# Patient Record
Sex: Female | Born: 1970 | Race: White | Hispanic: No | Marital: Married | Smoking: Never smoker
Health system: Southern US, Community
[De-identification: ages and names within clinical notes are randomized; demographics above are authoritative.]

## PROBLEM LIST (undated history)

## (undated) DIAGNOSIS — K589 Irritable bowel syndrome without diarrhea: Secondary | ICD-10-CM

## (undated) DIAGNOSIS — R011 Cardiac murmur, unspecified: Secondary | ICD-10-CM

## (undated) DIAGNOSIS — I341 Nonrheumatic mitral (valve) prolapse: Secondary | ICD-10-CM

## (undated) DIAGNOSIS — N6019 Diffuse cystic mastopathy of unspecified breast: Secondary | ICD-10-CM

## (undated) HISTORY — DX: Cardiac murmur, unspecified: R01.1

## (undated) HISTORY — DX: Irritable bowel syndrome, unspecified: K58.9

## (undated) HISTORY — DX: Diffuse cystic mastopathy of unspecified breast: N60.19

## (undated) HISTORY — PX: TONSILLECTOMY: SUR1361

## (undated) HISTORY — PX: TUBAL LIGATION: SHX77

## (undated) HISTORY — DX: Nonrheumatic mitral (valve) prolapse: I34.1

---

## 1993-08-18 DIAGNOSIS — I341 Nonrheumatic mitral (valve) prolapse: Secondary | ICD-10-CM

## 1993-08-18 HISTORY — DX: Nonrheumatic mitral (valve) prolapse: I34.1

## 1996-08-18 HISTORY — PX: CHOLECYSTECTOMY: SHX55

## 1998-06-20 ENCOUNTER — Other Ambulatory Visit: Admission: RE | Admit: 1998-06-20 | Discharge: 1998-06-20 | Payer: Self-pay | Admitting: Obstetrics and Gynecology

## 1998-11-14 ENCOUNTER — Inpatient Hospital Stay (HOSPITAL_COMMUNITY): Admission: AD | Admit: 1998-11-14 | Discharge: 1998-11-14 | Payer: Self-pay | Admitting: Obstetrics and Gynecology

## 1998-11-16 ENCOUNTER — Inpatient Hospital Stay (HOSPITAL_COMMUNITY): Admission: AD | Admit: 1998-11-16 | Discharge: 1998-11-16 | Payer: Self-pay | Admitting: *Deleted

## 1999-01-08 ENCOUNTER — Inpatient Hospital Stay (HOSPITAL_COMMUNITY): Admission: AD | Admit: 1999-01-08 | Discharge: 1999-01-11 | Payer: Self-pay | Admitting: Obstetrics and Gynecology

## 1999-02-13 ENCOUNTER — Other Ambulatory Visit: Admission: RE | Admit: 1999-02-13 | Discharge: 1999-02-13 | Payer: Self-pay | Admitting: Obstetrics and Gynecology

## 1999-04-05 ENCOUNTER — Other Ambulatory Visit: Admission: RE | Admit: 1999-04-05 | Discharge: 1999-04-05 | Payer: Self-pay | Admitting: *Deleted

## 2000-02-14 ENCOUNTER — Other Ambulatory Visit: Admission: RE | Admit: 2000-02-14 | Discharge: 2000-02-14 | Payer: Self-pay | Admitting: Obstetrics and Gynecology

## 2001-02-17 ENCOUNTER — Other Ambulatory Visit: Admission: RE | Admit: 2001-02-17 | Discharge: 2001-02-17 | Payer: Self-pay | Admitting: Obstetrics and Gynecology

## 2001-04-22 ENCOUNTER — Ambulatory Visit (HOSPITAL_COMMUNITY): Admission: RE | Admit: 2001-04-22 | Discharge: 2001-04-22 | Payer: Self-pay | Admitting: *Deleted

## 2001-04-22 ENCOUNTER — Encounter: Payer: Self-pay | Admitting: *Deleted

## 2001-09-12 ENCOUNTER — Inpatient Hospital Stay (HOSPITAL_COMMUNITY): Admission: AD | Admit: 2001-09-12 | Discharge: 2001-09-12 | Payer: Self-pay | Admitting: *Deleted

## 2001-09-12 ENCOUNTER — Encounter: Payer: Self-pay | Admitting: *Deleted

## 2001-09-22 ENCOUNTER — Emergency Department (HOSPITAL_COMMUNITY): Admission: EM | Admit: 2001-09-22 | Discharge: 2001-09-22 | Payer: Self-pay | Admitting: Emergency Medicine

## 2001-11-03 ENCOUNTER — Ambulatory Visit (HOSPITAL_COMMUNITY): Admission: AD | Admit: 2001-11-03 | Discharge: 2001-11-03 | Payer: Self-pay | Admitting: Obstetrics and Gynecology

## 2001-11-12 ENCOUNTER — Inpatient Hospital Stay (HOSPITAL_COMMUNITY): Admission: RE | Admit: 2001-11-12 | Discharge: 2001-11-16 | Payer: Self-pay | Admitting: Obstetrics and Gynecology

## 2001-12-10 ENCOUNTER — Other Ambulatory Visit: Admission: RE | Admit: 2001-12-10 | Discharge: 2001-12-10 | Payer: Self-pay | Admitting: Obstetrics and Gynecology

## 2002-12-12 ENCOUNTER — Other Ambulatory Visit: Admission: RE | Admit: 2002-12-12 | Discharge: 2002-12-12 | Payer: Self-pay | Admitting: Obstetrics and Gynecology

## 2004-06-06 ENCOUNTER — Encounter: Admission: RE | Admit: 2004-06-06 | Discharge: 2004-06-06 | Payer: Self-pay | Admitting: Unknown Physician Specialty

## 2004-06-06 IMAGING — US US MD EVAL AND MANAGEMENT - [facility]PT [facility]
1 series · 13 of 16 positions shown · non-contrast
Comparison: none

[Series 1: unknown · 13 of 33 slices shown]
[im 1/33]
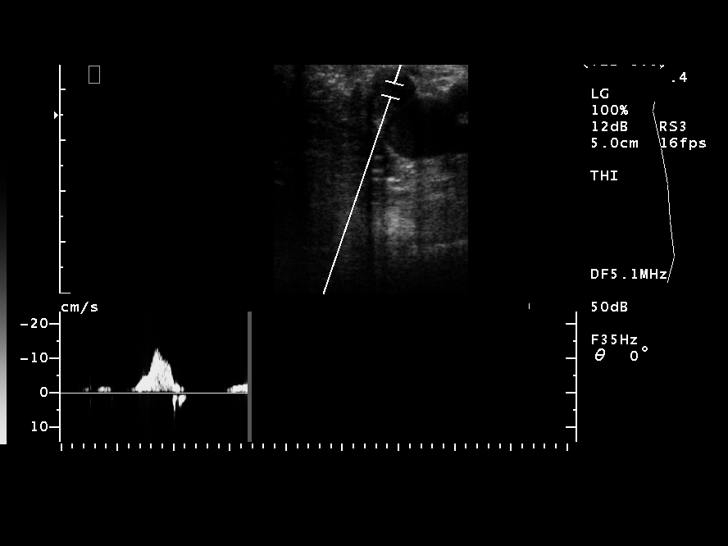
[im 3/33]
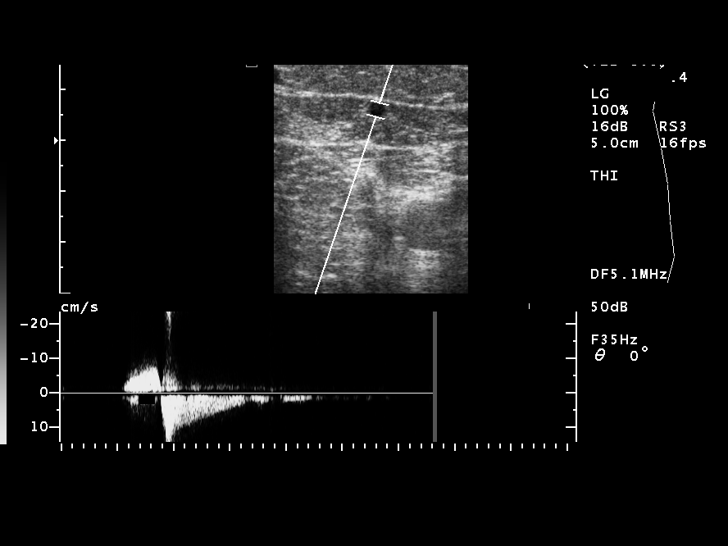
[im 7/33]
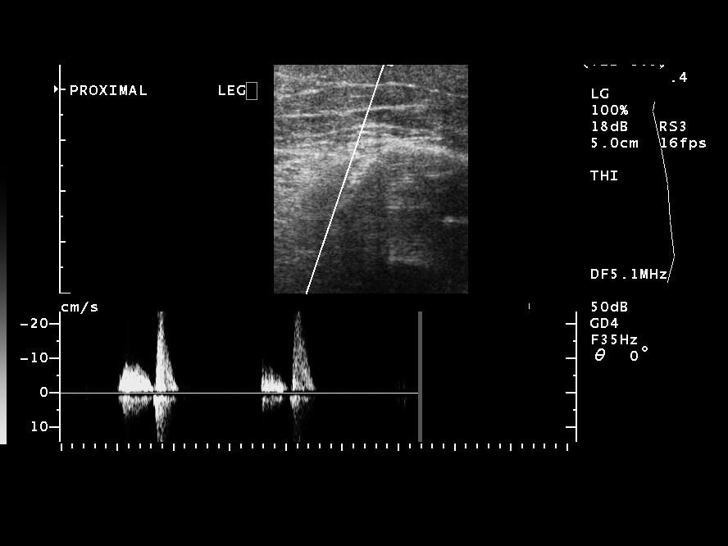
[im 9/33]
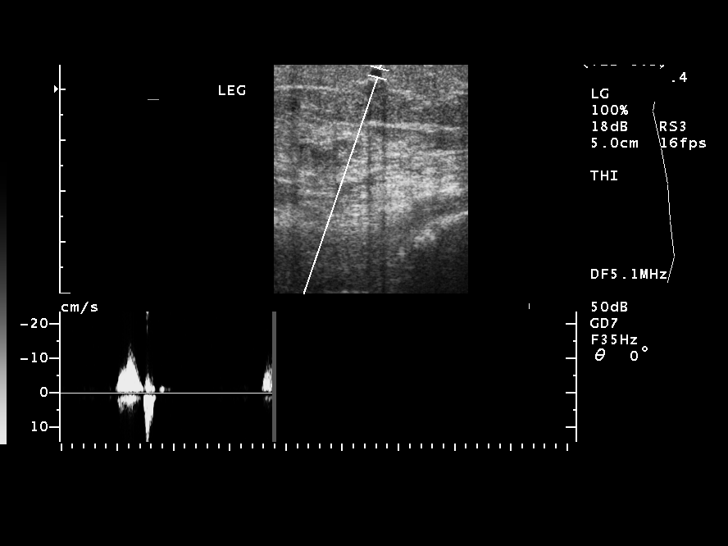
[im 11/33]
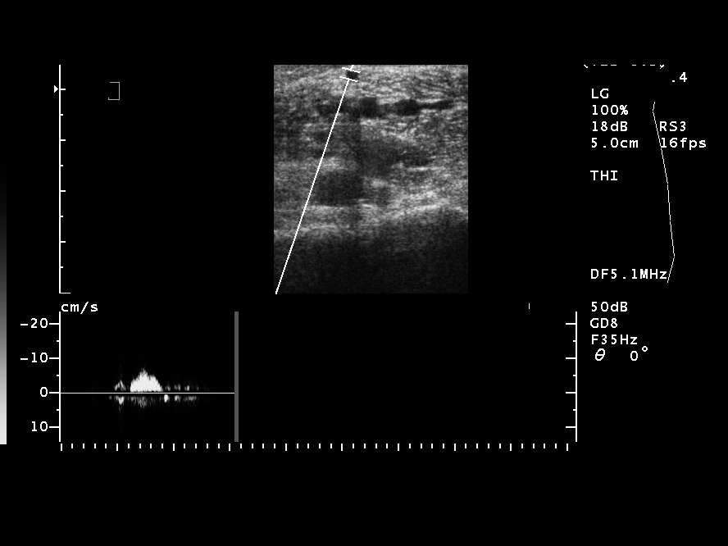
[im 13/33]
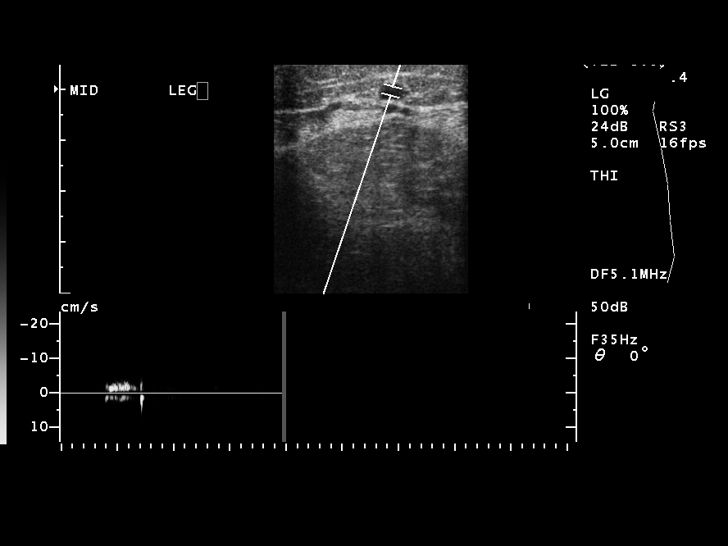
[im 18/33]
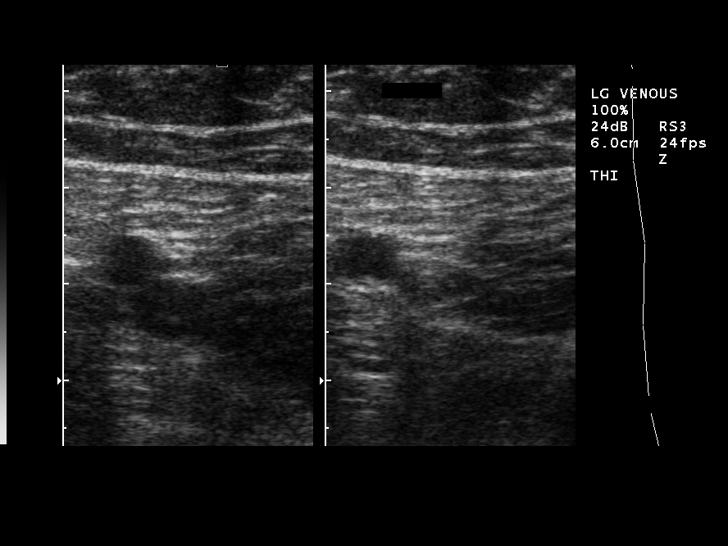
[im 20/33]
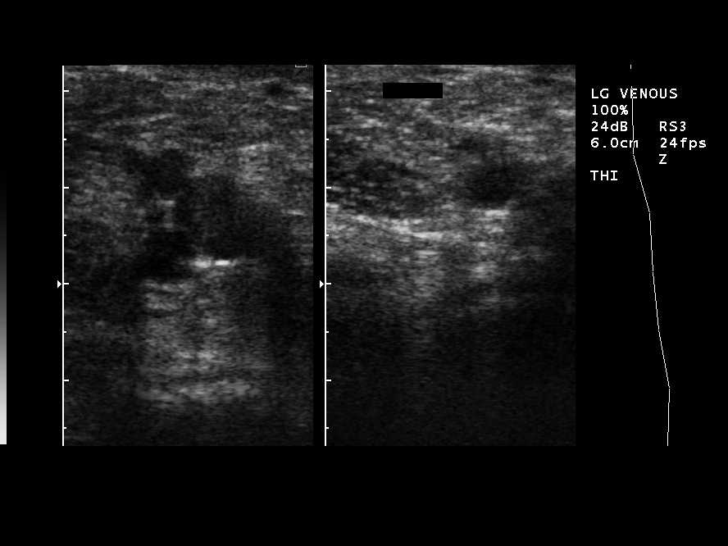
[im 22/33]
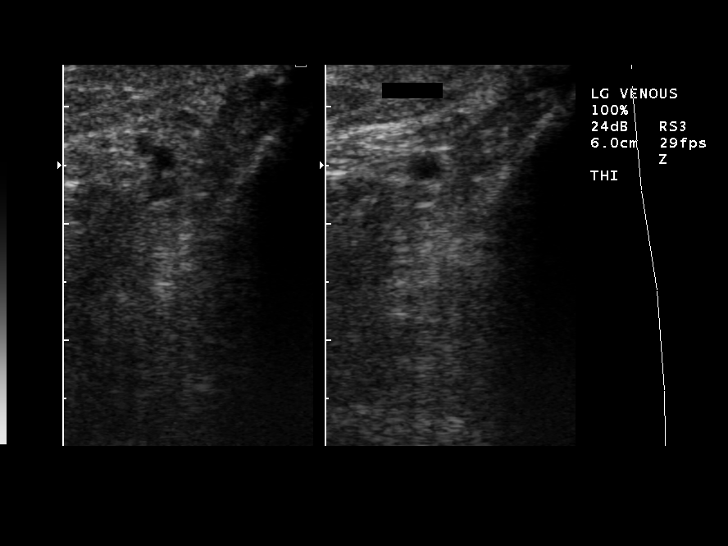
[im 24/33]
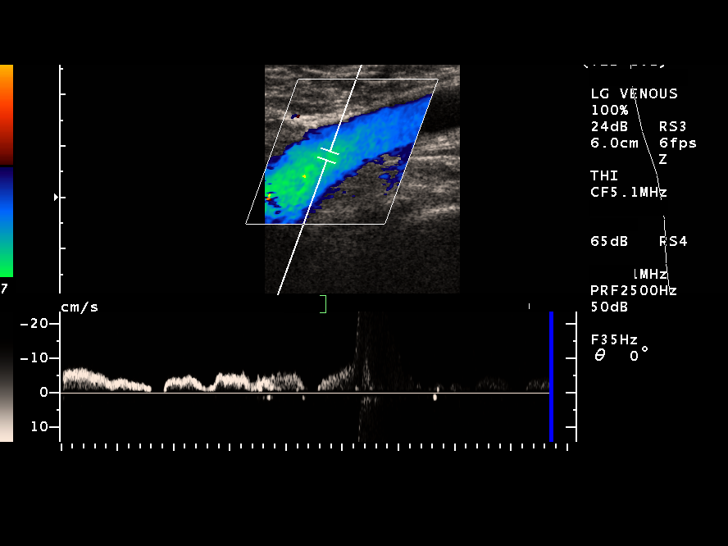
[im 26/33]
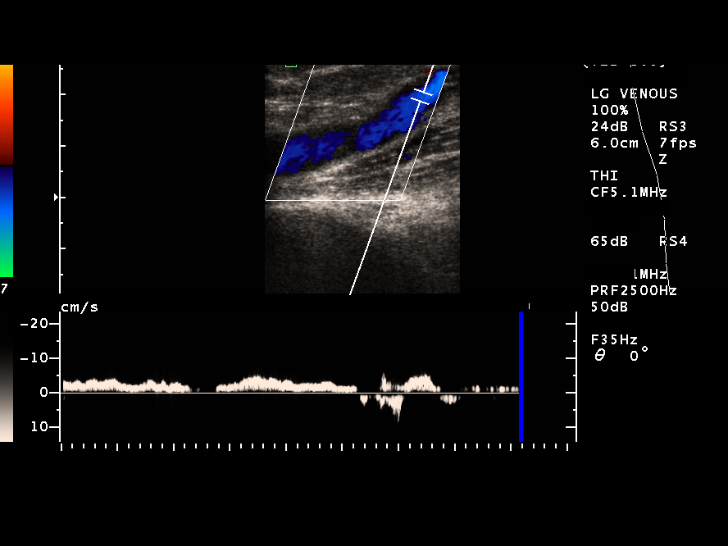
[im 30/33]
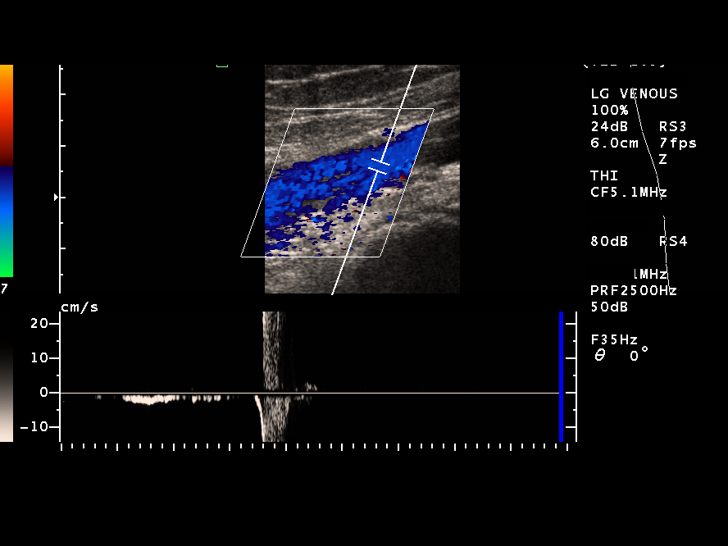
[im 33/33]
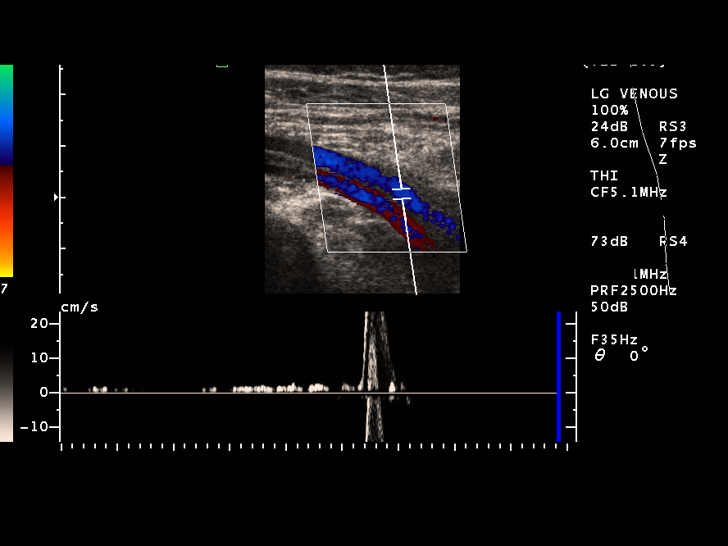

[13 of 16 positions shown; findings below may reference images not displayed]

June 06, 2004
 [HOSPITAL], [REDACTED].  [id number]
 RE:  Kampundu Serwenda (DOB ? [birth year])
 Dear Dr. Fu:
 Thank you for your referral of Ms. Ndy La to me for evaluation and possible treatment for lower extremity varicose veins.  As you know, Ms. Vie is a 33 year old with a history of worsening varicose veins in her left leg.  The patient describes that these arose during pregnancy and have worsened with subsequent pregnancies.  These were very severe and painful during her last pregnancy in 3552.  Since that time, the patient describes them as initially improving.  Now, however, they are worsening with significant left leg pain particularly around the large varicosities in her medial calf.  The patient also describes swelling in her left leg late in the day and discoloration.  
 The patient does have a strong family history of venous insufficiency with varicose veins in her mother and grandmother.  
 The patient has been in compression hose since 3552.  Despite this, the varicosities and the leg pain are worsening.  
 On physical exam, there are large varicosities noted within the medial proximal left calf.  Smaller varicosities are noted anteriorly in the left calf and posterior to the left knee.  No significant varicosity is noted at this time on the right.  
 The patient underwent left lower extremity venous ultrasound which was dictated in a separate report.  In short, this does show the patient has left great saphenous vein reflux from the mid thigh to the mid calf level.  The varicosities around the left knee and in the left calf do arise from this refluxing segment of great saphenous vein.  
 Given the patient?s symptomatology and venous ultrasound findings, the patient is a candidate for endovenous laser ablation of the left great saphenous vein. The patient is interested in proceeding with the procedure.  Therefore, she will be set up for the procedure in the near future.
 Again thank you for your referral of Ms. Vie to me for evaluation and treatment for lower extremity venous insufficiency.  We will keep you informed of further treatments and visits. 
 I spent approximately 45 minutes with the patient in direct consultation. 
 Sincerely,
 KGD:co

## 2004-08-18 HISTORY — PX: ABDOMINAL HYSTERECTOMY: SHX81

## 2004-08-18 HISTORY — PX: VEIN SURGERY: SHX48

## 2004-12-05 ENCOUNTER — Ambulatory Visit: Payer: Self-pay | Admitting: Unknown Physician Specialty

## 2007-04-16 ENCOUNTER — Ambulatory Visit: Payer: Self-pay | Admitting: Internal Medicine

## 2007-04-16 IMAGING — US US EXTREM LOW VENOUS*L*
1 series · 17 of 24 positions shown · non-contrast
Comparison: none

REASON FOR EXAM: pain hx DVT Eval for DVT  Call Report  [id number]
COMMENTS:

[Series 1: us extrem low venous*left* · 17 of 26 slices shown]
[im 1/26]
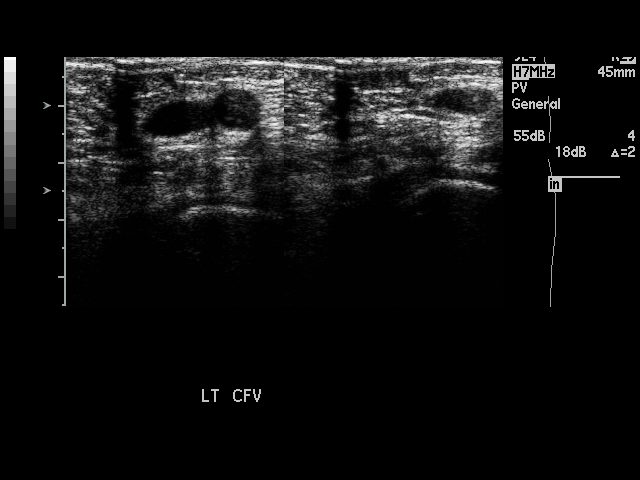
[im 3/26]
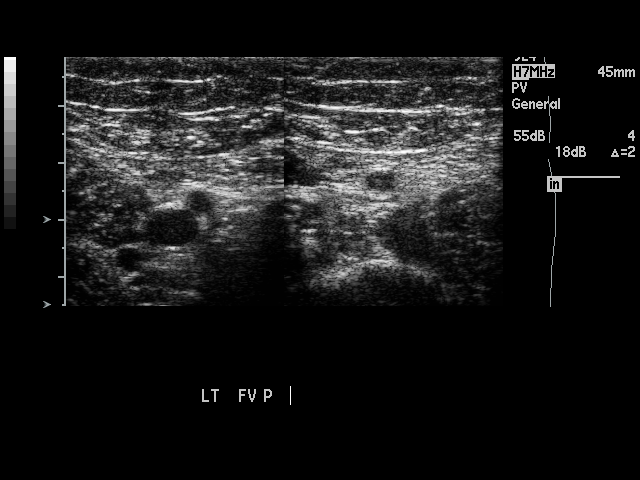
[im 4/26]
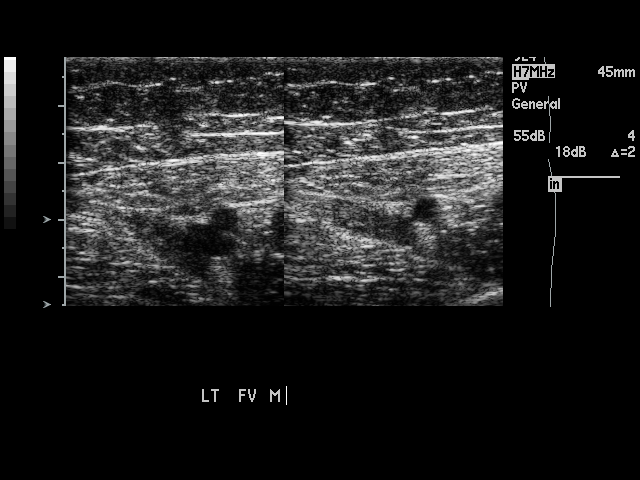
[im 5/26]
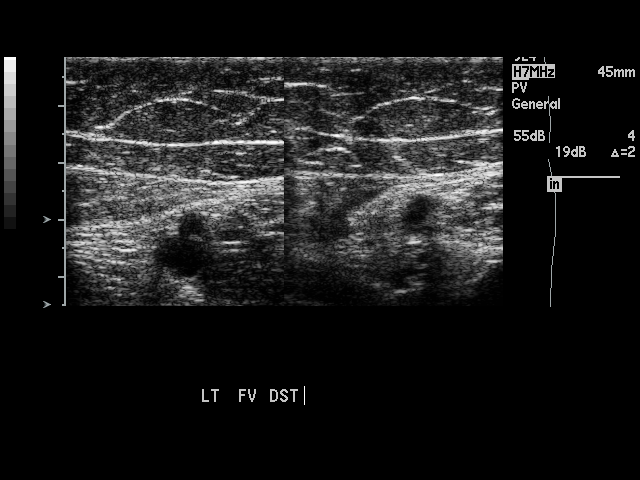
[im 7/26]
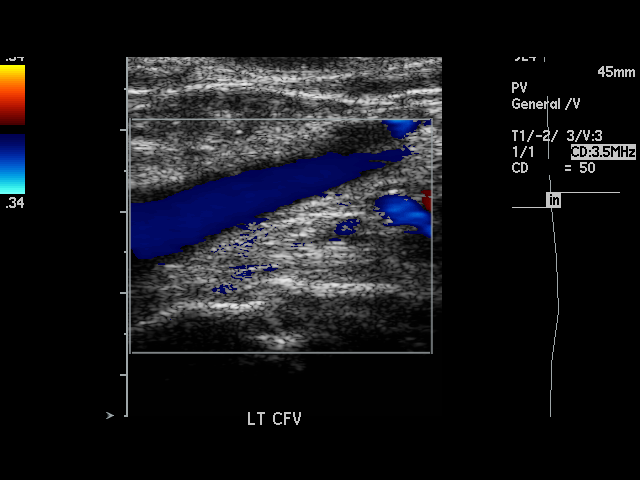
[im 8/26]
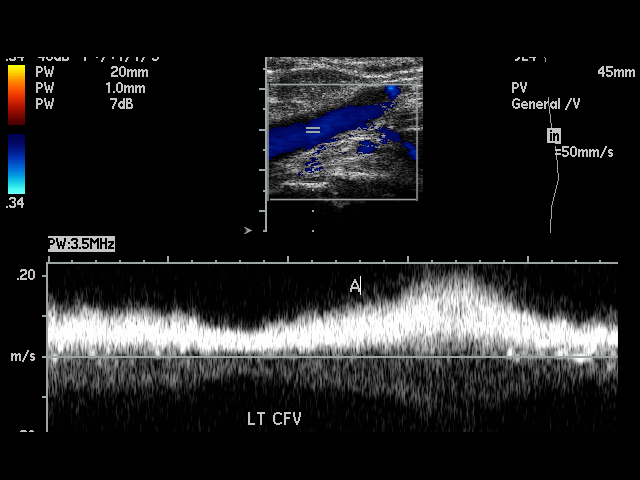
[im 10/26]
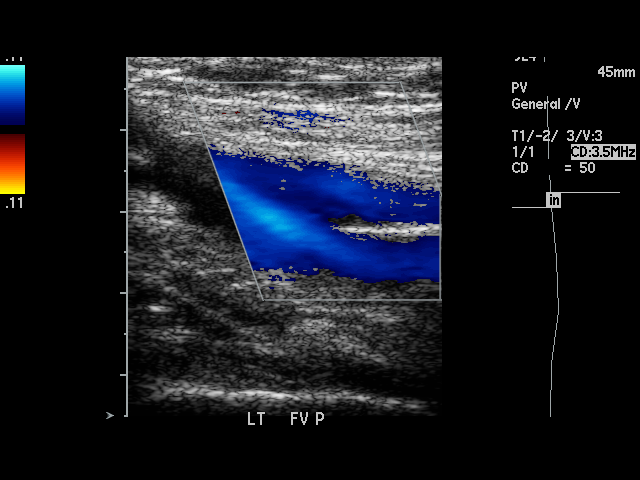
[im 11/26]
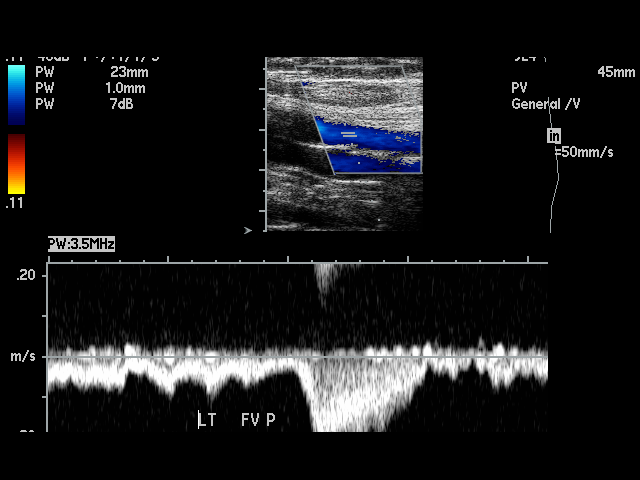
[im 14/26]
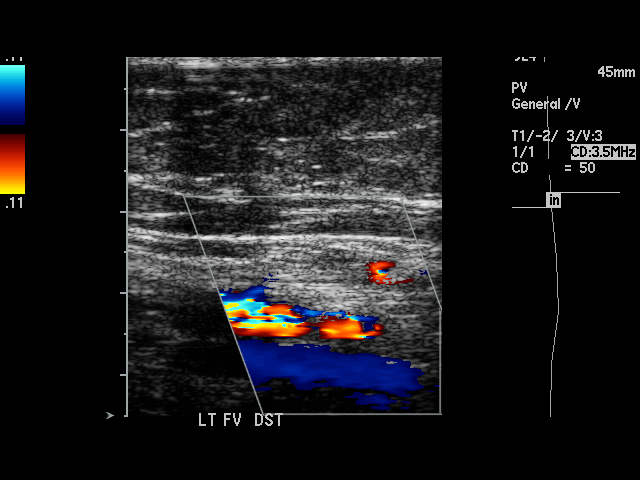
[im 15/26]
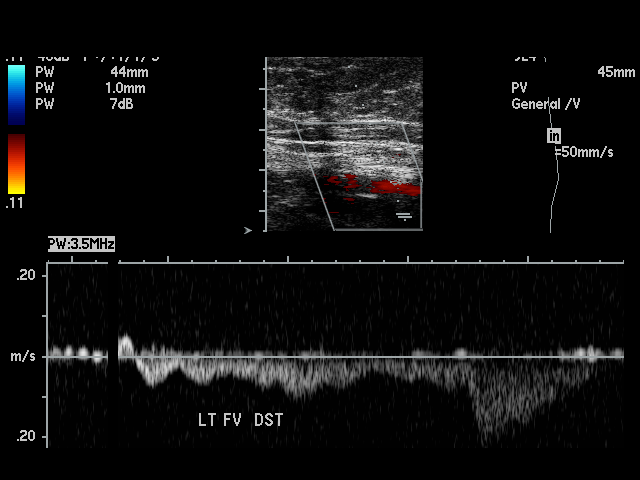
[im 16/26]
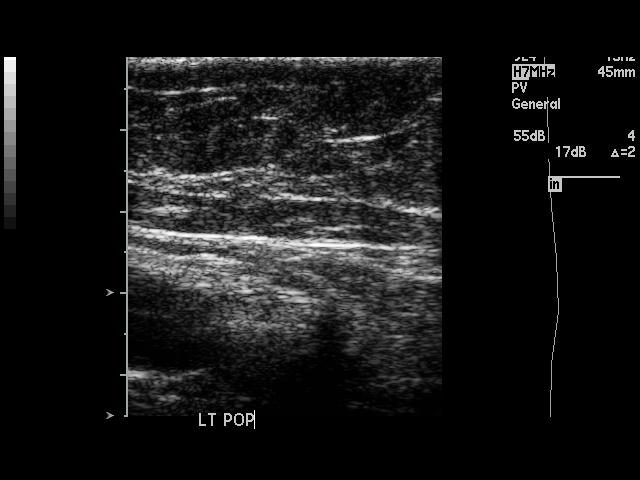
[im 18/26]
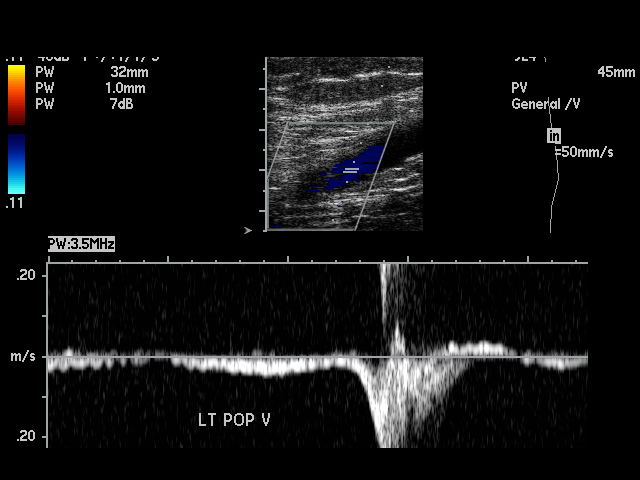
[im 19/26]
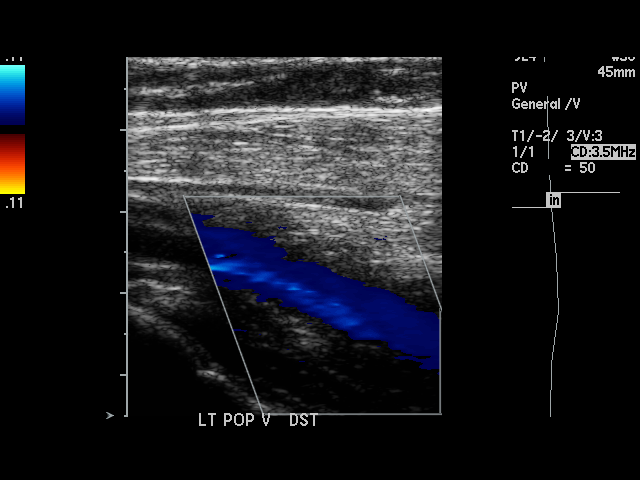
[im 21/26]
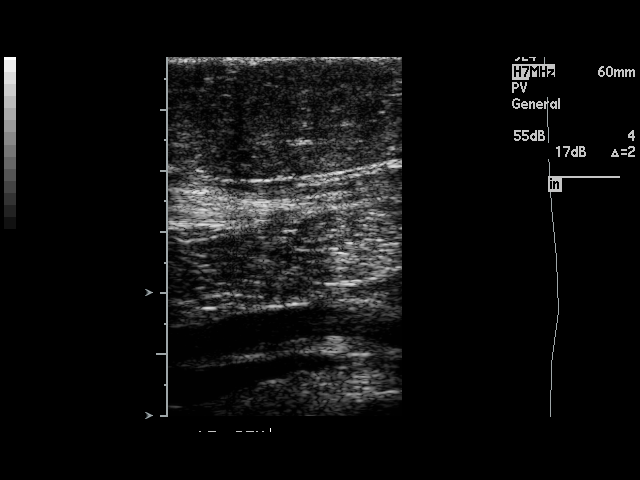
[im 22/26]
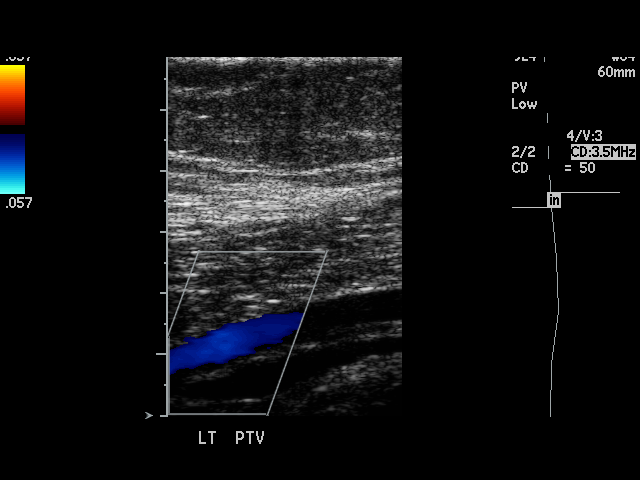
[im 23/26]
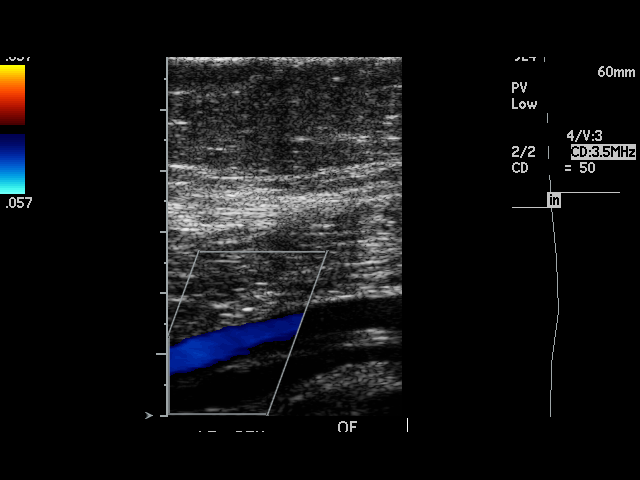
[im 26/26]
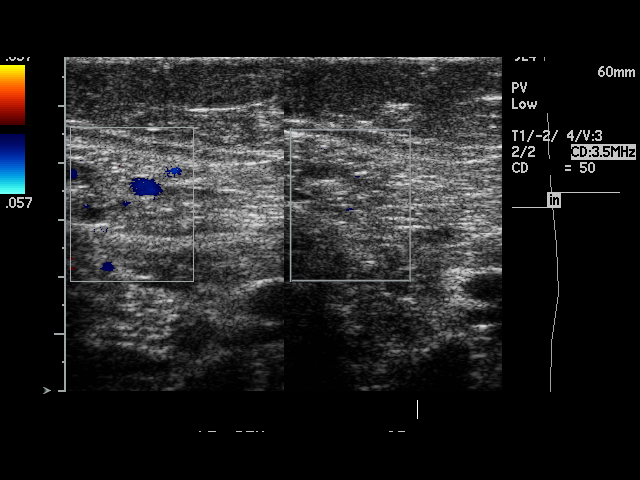

[17 of 24 positions shown; findings below may reference images not displayed]

PROCEDURE:     US  - US DOPPLER LOW EXTR LEFT  - April 16, 2007 [DATE]

RESULT:     The femoral and popliteal augmentation flow waveforms are
normal. There is noted complete compressibility of the LEFT the femoral and
popliteal vein. Doppler examination shows no occlusion or evidence of deep
venous thrombosis.
IMPRESSION: No deep vein thrombosis is identified in the LEFT leg.

## 2009-08-18 DIAGNOSIS — N6019 Diffuse cystic mastopathy of unspecified breast: Secondary | ICD-10-CM

## 2009-08-18 HISTORY — DX: Diffuse cystic mastopathy of unspecified breast: N60.19

## 2010-07-18 ENCOUNTER — Ambulatory Visit: Payer: Self-pay | Admitting: Unknown Physician Specialty

## 2011-06-16 ENCOUNTER — Ambulatory Visit: Payer: Self-pay | Admitting: General Surgery

## 2011-06-17 LAB — PATHOLOGY REPORT

## 2011-12-03 ENCOUNTER — Emergency Department: Payer: Self-pay | Admitting: Internal Medicine

## 2011-12-03 LAB — CBC WITH DIFFERENTIAL/PLATELET
Basophil %: 0.5 %
Eosinophil #: 0 10*3/uL (ref 0.0–0.7)
HCT: 41.3 % (ref 35.0–47.0)
HGB: 13.4 g/dL (ref 12.0–16.0)
Lymphocyte #: 0.9 10*3/uL — ABNORMAL LOW (ref 1.0–3.6)
Lymphocyte %: 12.1 %
MCHC: 32.5 g/dL (ref 32.0–36.0)
MCV: 95 fL (ref 80–100)
Monocyte #: 0.4 x10 3/mm (ref 0.2–0.9)
Neutrophil #: 6.1 10*3/uL (ref 1.4–6.5)
RBC: 4.34 10*6/uL (ref 3.80–5.20)

## 2011-12-03 LAB — BASIC METABOLIC PANEL
Anion Gap: 10 (ref 7–16)
BUN: 11 mg/dL (ref 7–18)
Co2: 27 mmol/L (ref 21–32)
Creatinine: 0.67 mg/dL (ref 0.60–1.30)
EGFR (African American): >60
Osmolality: 277 (ref 275–301)
Potassium: 3.9 mmol/L (ref 3.5–5.1)
Sodium: 139 mmol/L (ref 136–145)

## 2012-09-10 ENCOUNTER — Other Ambulatory Visit: Payer: Self-pay | Admitting: Internal Medicine

## 2012-09-12 ENCOUNTER — Emergency Department: Payer: Self-pay | Admitting: Unknown Physician Specialty

## 2012-09-12 LAB — URINALYSIS, COMPLETE
Bilirubin,UR: NEGATIVE
Glucose,UR: NEGATIVE mg/dL (ref 0–75)
Nitrite: NEGATIVE
Ph: 7 (ref 4.5–8.0)
Protein: NEGATIVE
WBC UR: 6 /HPF (ref 0–5)

## 2012-09-12 LAB — CBC WITH DIFFERENTIAL/PLATELET
Basophil #: 0.1 10*3/uL (ref 0.0–0.1)
Eosinophil #: 0 10*3/uL (ref 0.0–0.7)
Lymphocyte #: 1.3 10*3/uL (ref 1.0–3.6)
Lymphocyte %: 15.9 %
MCHC: 34.4 g/dL (ref 32.0–36.0)
MCV: 95 fL (ref 80–100)
Platelet: 315 10*3/uL (ref 150–440)
RBC: 4.55 10*6/uL (ref 3.80–5.20)

## 2012-09-12 LAB — HEPATIC FUNCTION PANEL A (ARMC)
Albumin: 4.2 g/dL (ref 3.4–5.0)
Alkaline Phosphatase: 82 U/L (ref 50–136)
Bilirubin, Direct: 0.2 mg/dL (ref 0.00–0.20)
Bilirubin,Total: 0.9 mg/dL (ref 0.2–1.0)
SGOT(AST): 18 U/L (ref 15–37)
SGPT (ALT): 20 U/L (ref 12–78)
Total Protein: 8.1 g/dL (ref 6.4–8.2)

## 2012-09-12 LAB — BASIC METABOLIC PANEL
Anion Gap: 6 — ABNORMAL LOW (ref 7–16)
Calcium, Total: 9.2 mg/dL (ref 8.5–10.1)
Creatinine: 0.59 mg/dL — ABNORMAL LOW (ref 0.60–1.30)
EGFR (African American): >60
Glucose: 124 mg/dL — ABNORMAL HIGH (ref 65–99)
Osmolality: 278 (ref 275–301)
Sodium: 139 mmol/L (ref 136–145)

## 2012-09-12 IMAGING — CR DG CHEST 2V
1 series · 2 of 2 positions shown · non-contrast
Comparison: none

REASON FOR EXAM: weakness enlarged lymph nodes
COMMENTS:

PROCEDURE:     DXR - DXR CHEST PA (OR AP) AND LATERAL  - September 12, 2012  [DATE]
RESULT:     The lungs are clear. The heart and pulmonary vessels are normal.
The bony and mediastinal structures are unremarkable. There is no effusion.
There is no pneumothorax or evidence of congestive failure.

[Series 1: w chest pa · 0.14mm/px · 2 of 2 slices shown]
[im 1/2]
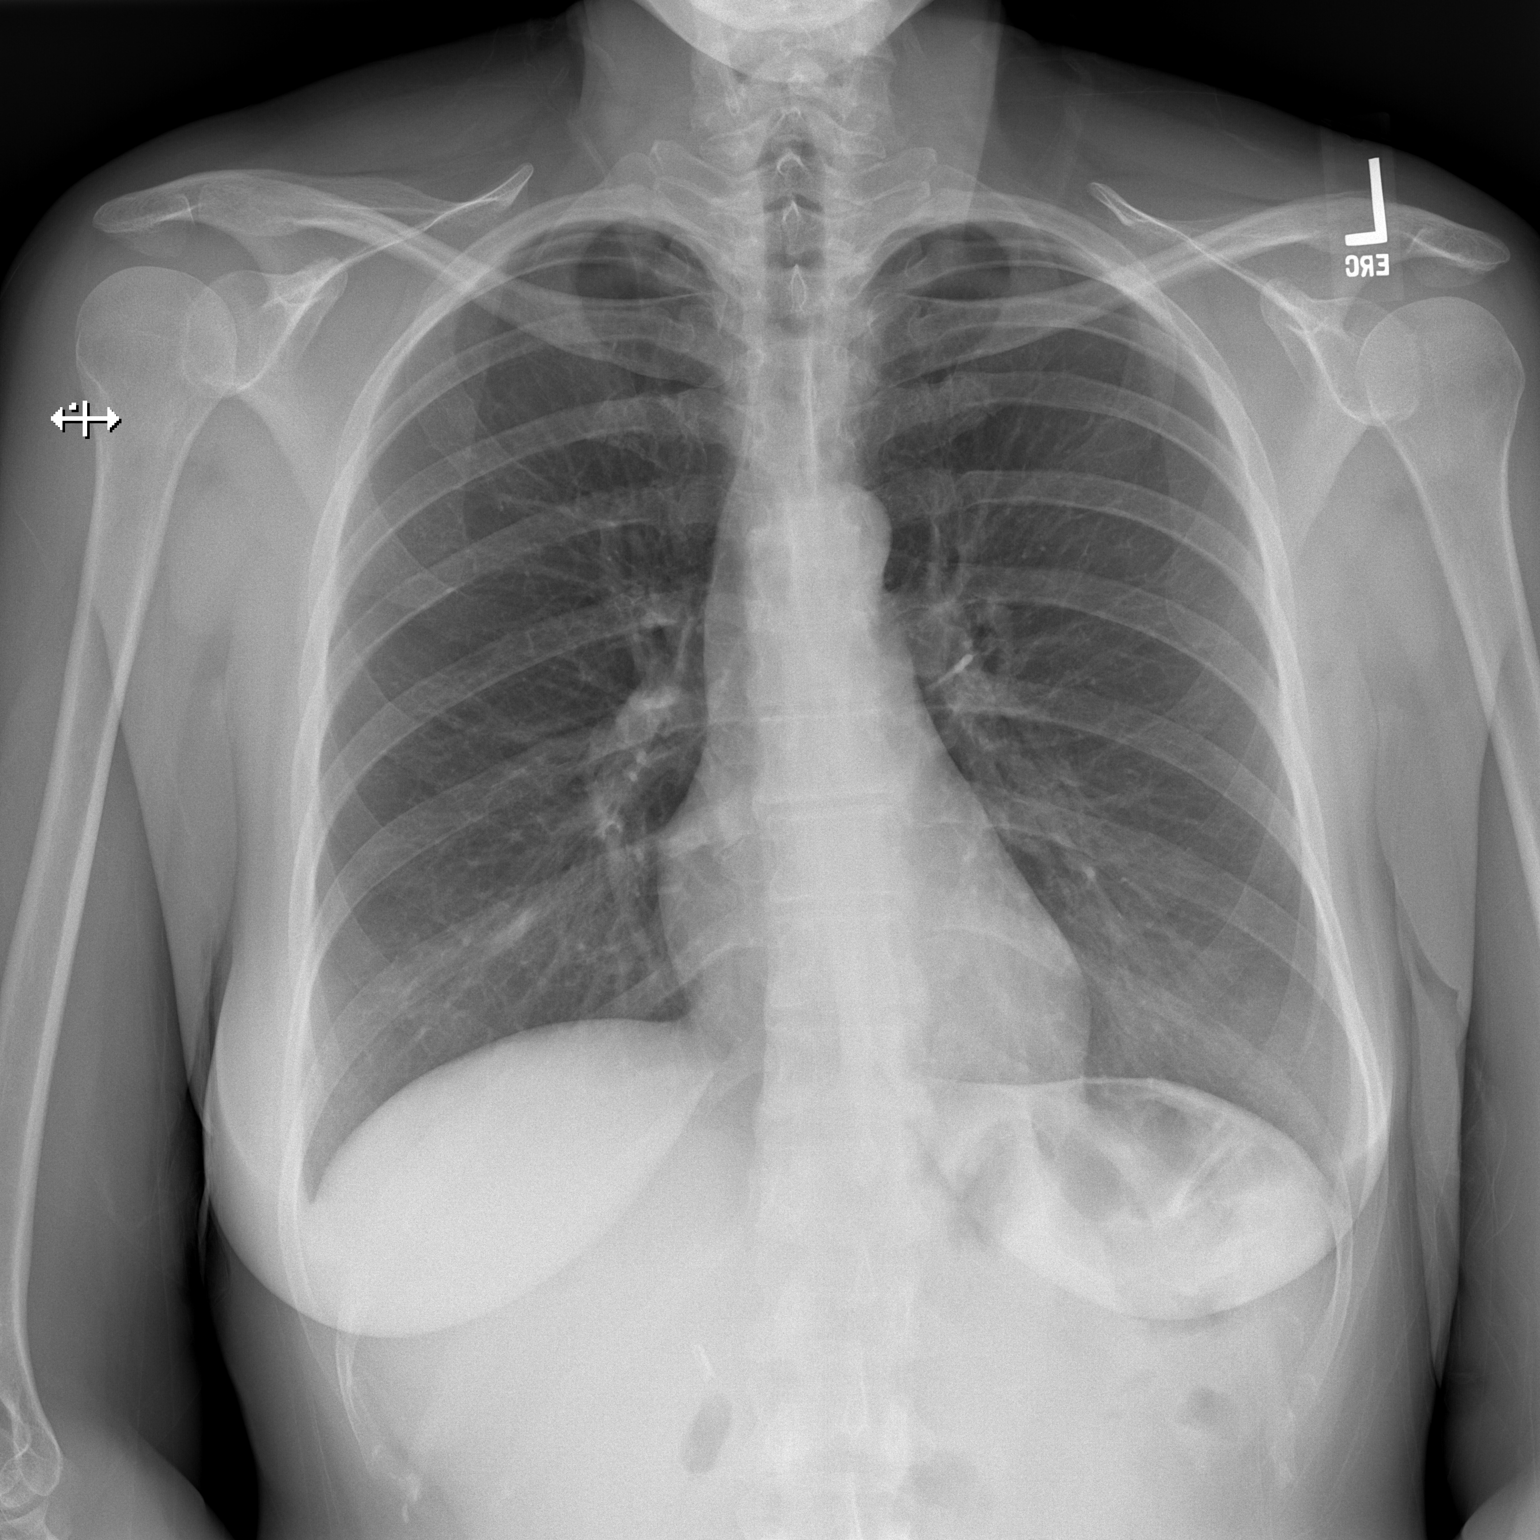
[im 2/2]
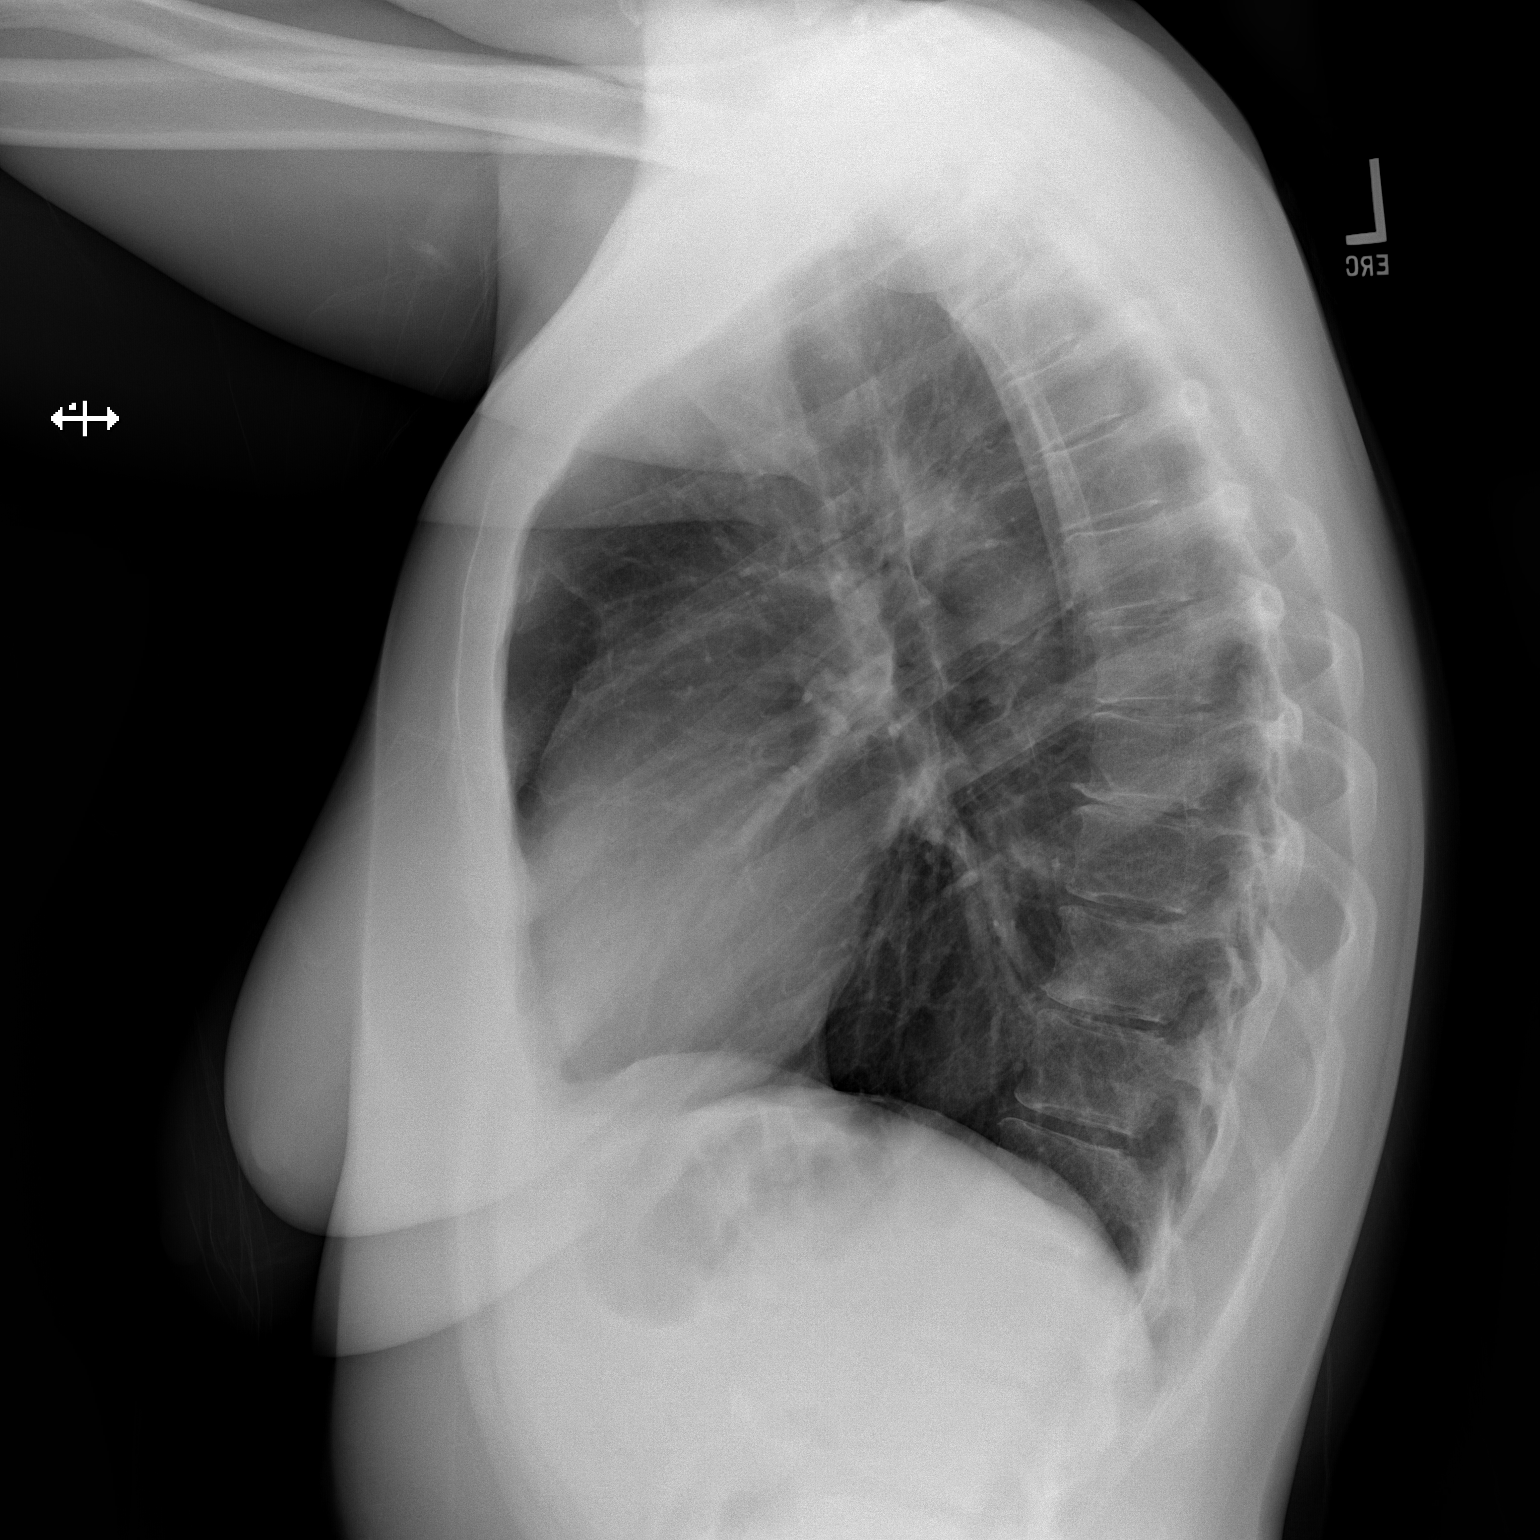

[2 of 2 positions shown; findings below may reference images not displayed]

IMPRESSION: No acute cardiopulmonary disease.

[REDACTED]

## 2012-09-14 LAB — BETA STREP CULTURE(ARMC)

## 2012-09-16 LAB — CULTURE, BLOOD (SINGLE)

## 2012-09-29 ENCOUNTER — Ambulatory Visit: Payer: Self-pay | Admitting: Internal Medicine

## 2012-09-29 IMAGING — US ABDOMEN ULTRASOUND
1 series · 14 of 25 positions shown · non-contrast
Comparison: none

REASON FOR EXAM: recent mono  eval splenomegaly
COMMENTS:

[Series 1: abdomen ultrasound · 0.31mm/px · 14 of 52 slices shown]
[im 1/52]
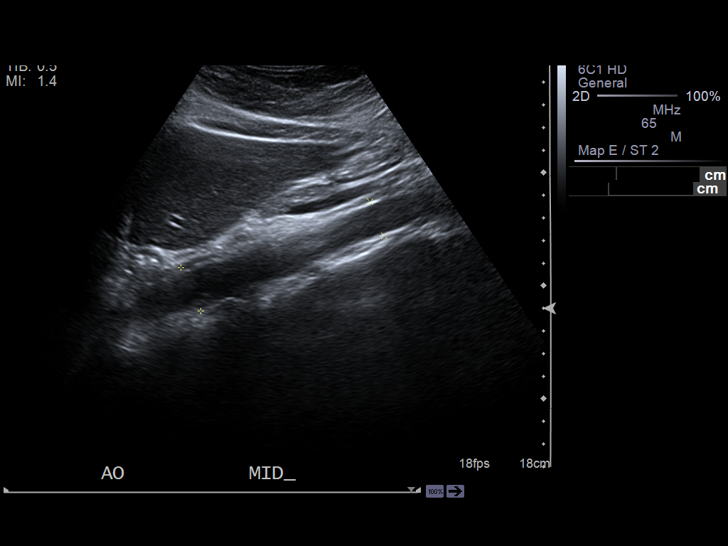
[im 5/52]
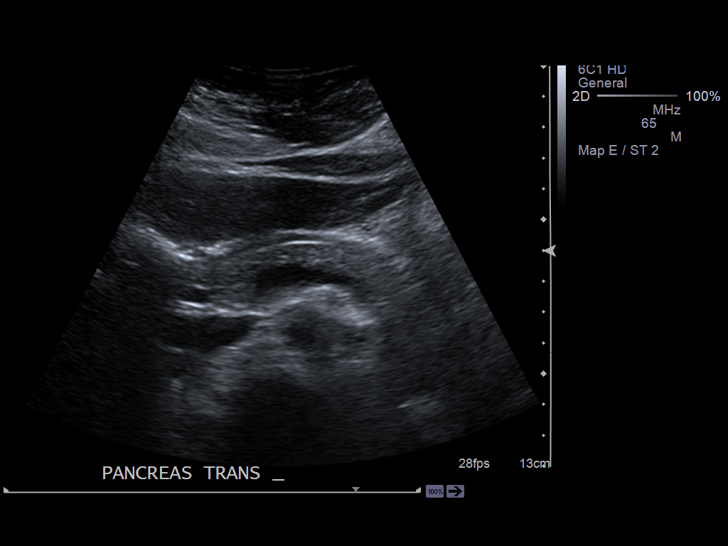
[im 9/52]
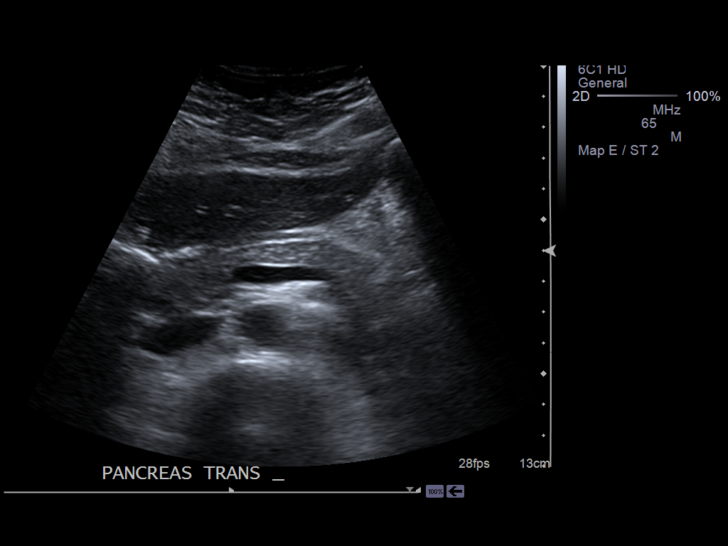
[im 13/52]
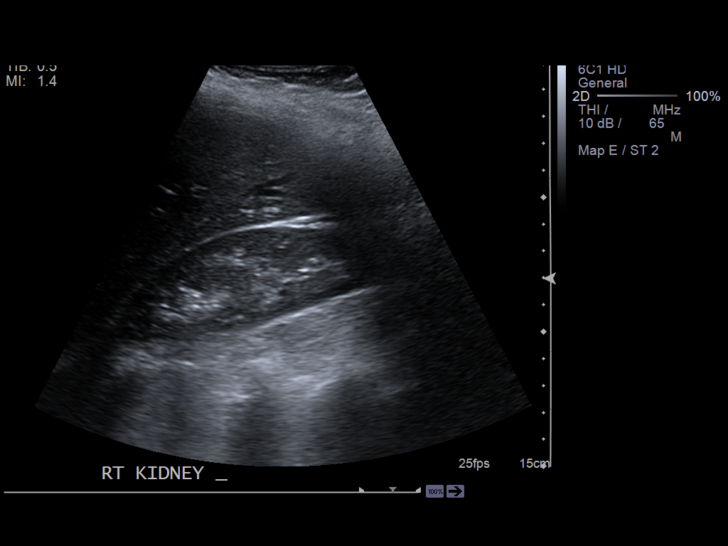
[im 18/52]
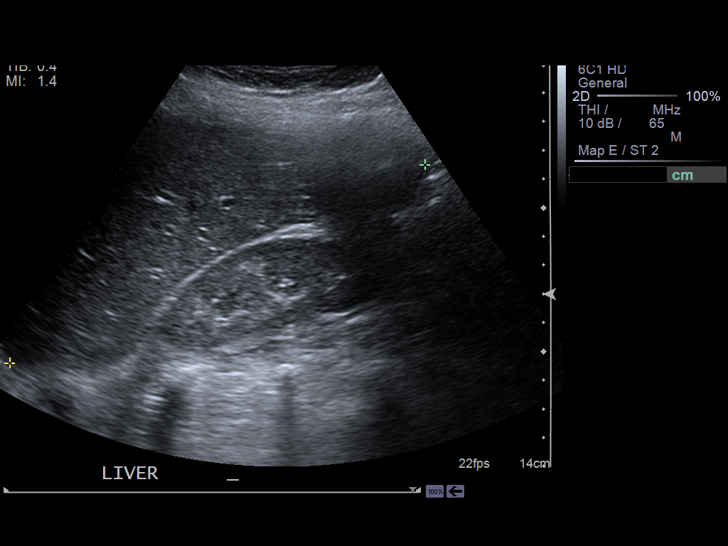
[im 20/52]
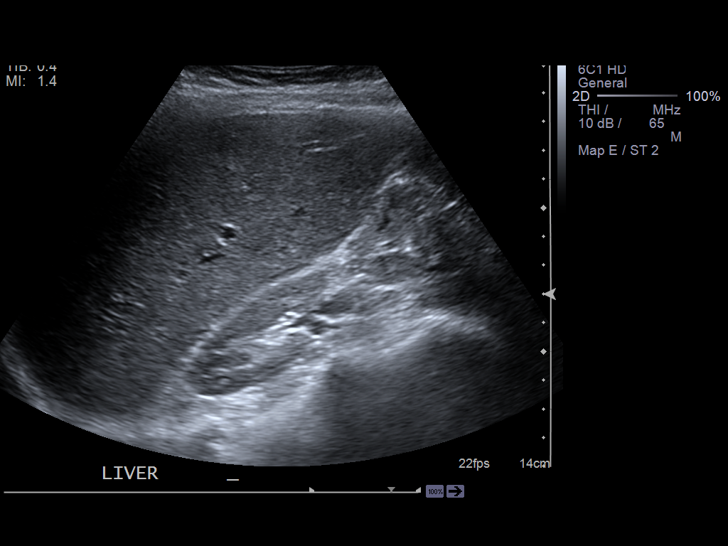
[im 24/52]
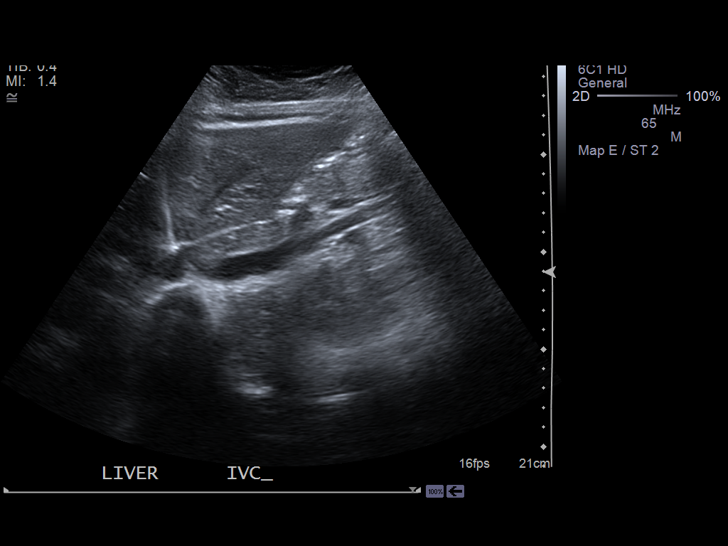
[im 28/52]
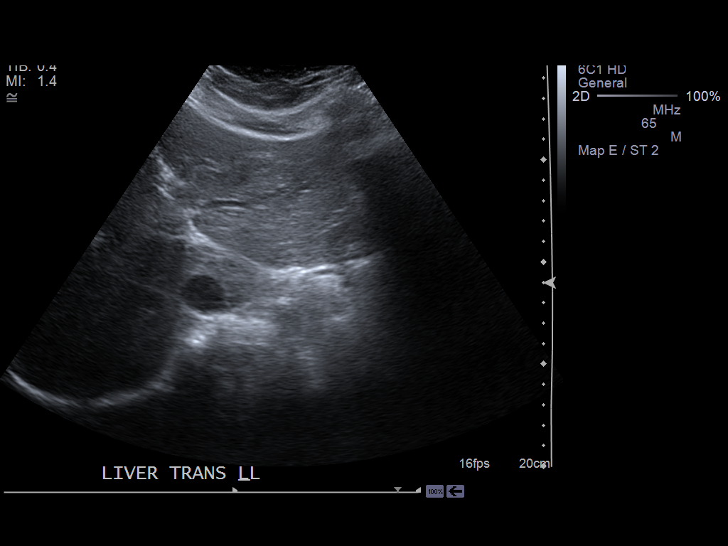
[im 32/52]
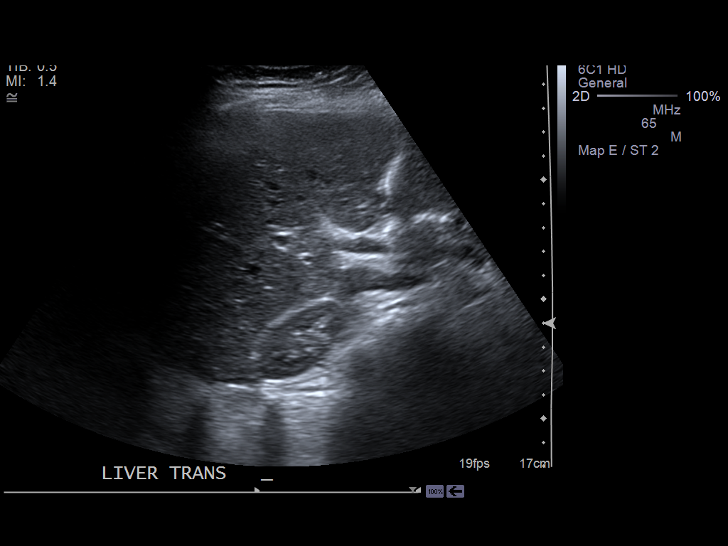
[im 35/52]
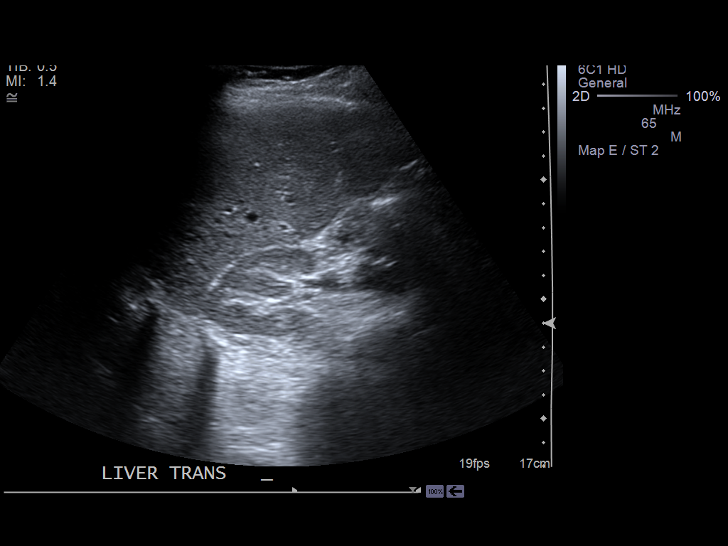
[im 39/52]
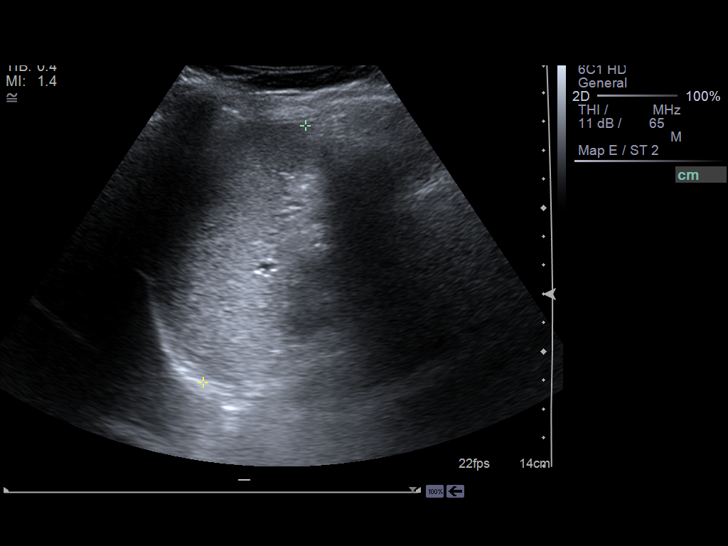
[im 43/52]
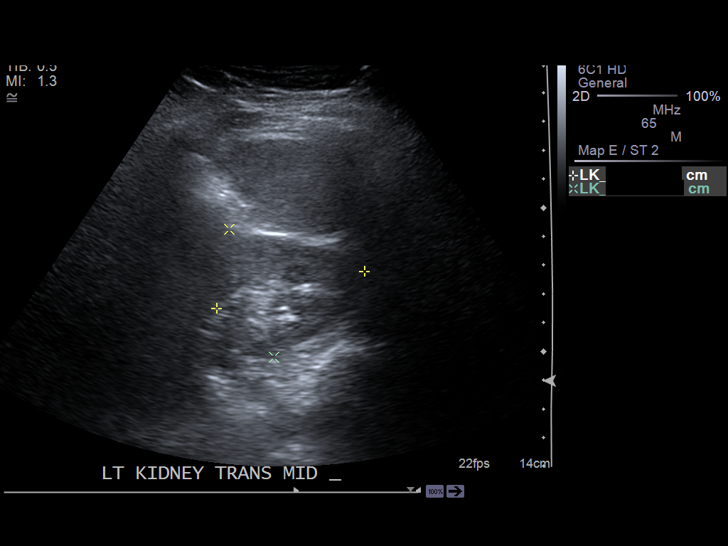
[im 47/52]
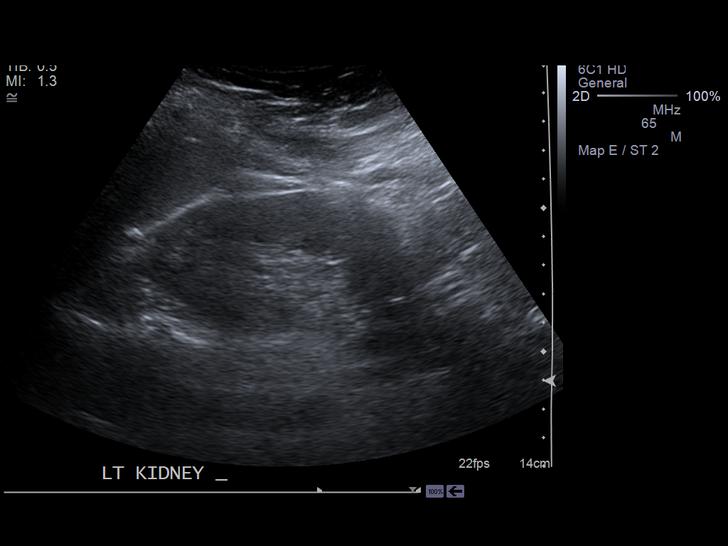
[im 52/52]
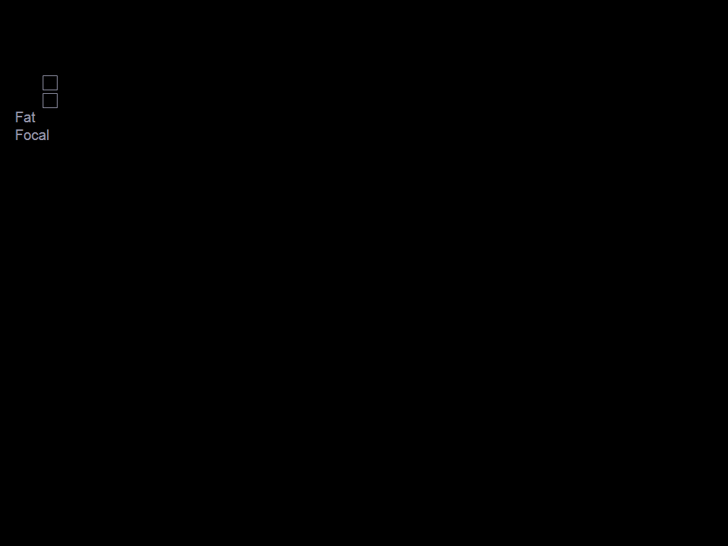

[14 of 25 positions shown; findings below may reference images not displayed]

PROCEDURE:     US  - US ABDOMEN GENERAL SURVEY  - September 29, 2012  [DATE]

RESULT:     Sonographic evaluation of the abdomen shows the visualized
abdominal aorta is normal. The pancreas appears to be normal. Portions of
the tail are not well-seen. There is no pancreatic ductal dilation. The
right kidney measures 10.28 x 4.77 x 4.34 cm without obstruction or mass.
Cortical thickness is 10.1 mm. The liver echotexture is normal. The portal
venous flow is normal. There is no ascites. There is a history of
laparoscopic cholecystectomy in 6111. The common bile duct diameter is
mm. There is no intrahepatic biliary ductal dilation. Spleen is normal in
echotexture and size measuring up to 9.6 cm. The left kidney measures
x 5.30 x 4.72 cm and demonstrates a cortical thickness of 11.1 mm without
evidence of a solid or cystic mass. No stones are evident in either kidney.
IMPRESSION: Normal appearing post cholecystectomy abdominal sonogram.

[REDACTED]

## 2012-10-09 ENCOUNTER — Encounter: Payer: Self-pay | Admitting: *Deleted

## 2012-11-24 ENCOUNTER — Encounter: Payer: Self-pay | Admitting: General Surgery

## 2012-11-29 ENCOUNTER — Other Ambulatory Visit: Payer: Self-pay | Admitting: *Deleted

## 2012-11-29 ENCOUNTER — Other Ambulatory Visit: Payer: Self-pay | Admitting: General Surgery

## 2012-11-29 ENCOUNTER — Encounter: Payer: Self-pay | Admitting: General Surgery

## 2012-11-29 ENCOUNTER — Ambulatory Visit (INDEPENDENT_AMBULATORY_CARE_PROVIDER_SITE_OTHER): Payer: 59 | Admitting: General Surgery

## 2012-11-29 VITALS — BP 134/72 | HR 76 | Resp 14 | Ht 67.0 in | Wt 173.0 lb

## 2012-11-29 DIAGNOSIS — Z1231 Encounter for screening mammogram for malignant neoplasm of breast: Secondary | ICD-10-CM

## 2012-11-29 DIAGNOSIS — N6019 Diffuse cystic mastopathy of unspecified breast: Secondary | ICD-10-CM

## 2012-11-29 NOTE — Patient Instructions (Addendum)
Patient to return in one year with bilateral screening mammogram.  

## 2012-11-29 NOTE — Progress Notes (Signed)
Patient will be asked to return to the office in one year for a bilateral screening mammogram. This is to be arranged at [facility].

## 2012-11-29 NOTE — Progress Notes (Signed)
Patient ID: Joy Pineda, female   DOB: 22-Sep-1970, 42 y.o.   MRN: [id number]  Chief Complaint  Patient presents with  . Follow-up    mammogram    HPI Joy Pineda is a 42 y.o. female here today for her follow up mammogram done at [facility] on 11/22/12 .Patient states no new breast problems.She has had bilateral stereotatic biopsy.Patient has a history of  FCD and Pash. 43} .   HPI  Past Medical History  Diagnosis Date  . Heart murmur   . Mitral valve prolapse 1995  . 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 Irritable bowel syndrome     Past Surgical History  Procedure Laterality Date  . Cesarean section  [phone]  . Tubal ligation    . Cholecystectomy  1998  . Abdominal hysterectomy  2006  . Tonsillectomy    . Vein surgery  2006    History reviewed. No pertinent family history.  Social History History  Substance Use Topics  . Smoking status: Never Smoker   . Smokeless tobacco: Never Used  . Alcohol Use: No    Allergies  Allergen Reactions  . Shellfish Allergy Hives  . Tape     BLISTERS    Current Outpatient Prescriptions  Medication Sig Dispense Refill  . diltiazem (CARDIZEM) 30 MG tablet Take 30 mg by mouth as needed.      . pantoprazole (PROTONIX) 20 MG tablet Take 20 mg by mouth daily.       No current facility-administered medications for this visit.    Review of Systems Review of Systems  Constitutional: Negative.   Respiratory: Negative.   Cardiovascular: Negative.     Blood pressure 134/72, pulse 76, resp. rate 14, height 5\' 7"  (1.702 m), weight 173 lb (78.472 kg).  Physical Exam Physical Exam  Constitutional: She appears well-developed and well-nourished.  Eyes: Conjunctivae are normal. No scleral icterus.  Neck: Normal range of motion. Neck supple.  Cardiovascular: Normal rate, regular rhythm and normal heart sounds.   Pulmonary/Chest: Effort normal and breath sounds normal. Right breast exhibits no inverted nipple, no mass, no  nipple discharge, no skin change and no tenderness. Left breast exhibits no inverted nipple, no mass, no nipple discharge, no skin change and no tenderness.  Lymphadenopathy:    She has no cervical adenopathy.    She has no axillary adenopathy.    Data Reviewed Mammogram reviewed-BIRADS 2  Assessment    Stable exam.     Plan    Continue monthly SBE, f/u in 1 yr.        Braxson Hollingsworth G 11/30/2012, 5:47 PM

## 2012-11-30 ENCOUNTER — Encounter: Payer: Self-pay | Admitting: General Surgery

## 2012-11-30 DIAGNOSIS — N6019 Diffuse cystic mastopathy of unspecified breast: Secondary | ICD-10-CM | POA: Insufficient documentation

## 2013-12-06 ENCOUNTER — Ambulatory Visit: Payer: 59 | Admitting: General Surgery

## 2014-01-03 ENCOUNTER — Encounter: Payer: Self-pay | Admitting: *Deleted

## 2014-06-19 ENCOUNTER — Encounter: Payer: Self-pay | Admitting: General Surgery

## 2014-06-23 DIAGNOSIS — Z8679 Personal history of other diseases of the circulatory system: Secondary | ICD-10-CM | POA: Insufficient documentation

## 2014-07-24 DIAGNOSIS — E538 Deficiency of other specified B group vitamins: Secondary | ICD-10-CM | POA: Insufficient documentation

## 2016-05-08 ENCOUNTER — Other Ambulatory Visit: Payer: Self-pay | Admitting: Physician Assistant

## 2016-05-08 DIAGNOSIS — R102 Pelvic and perineal pain: Secondary | ICD-10-CM

## 2016-05-14 ENCOUNTER — Ambulatory Visit
Admission: RE | Admit: 2016-05-14 | Discharge: 2016-05-14 | Disposition: A | Discharge status: Home / Self Care | Payer: 59 | Source: Ambulatory Visit | Attending: Physician Assistant | Admitting: Physician Assistant

## 2016-05-14 DIAGNOSIS — N9489 Other specified conditions associated with female genital organs and menstrual cycle: Secondary | ICD-10-CM | POA: Insufficient documentation

## 2016-05-14 DIAGNOSIS — R102 Pelvic and perineal pain: Secondary | ICD-10-CM | POA: Insufficient documentation

## 2016-05-14 DIAGNOSIS — Z90710 Acquired absence of both cervix and uterus: Secondary | ICD-10-CM | POA: Insufficient documentation

## 2016-05-14 IMAGING — US US PELVIS COMPLETE
1 series · 14 of 25 positions shown · non-contrast
Comparison: None.

CLINICAL DATA: Pelvic pain and pressure for 3 weeks.



[Series 1: us pelvis complete · 0.20mm/px · 14 of 82 slices shown]
[im 1/82]
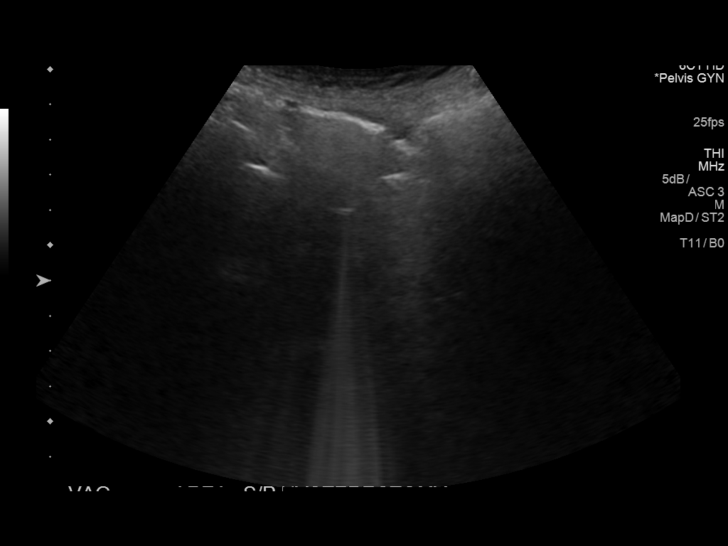
[im 7/82]
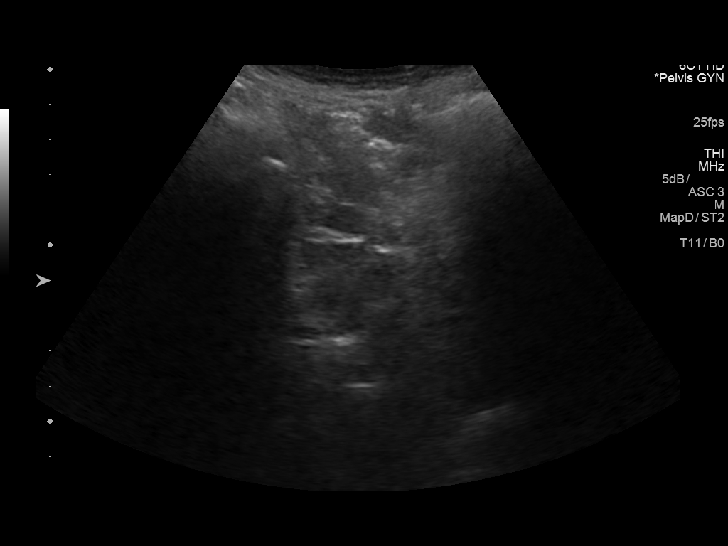
[im 14/82]
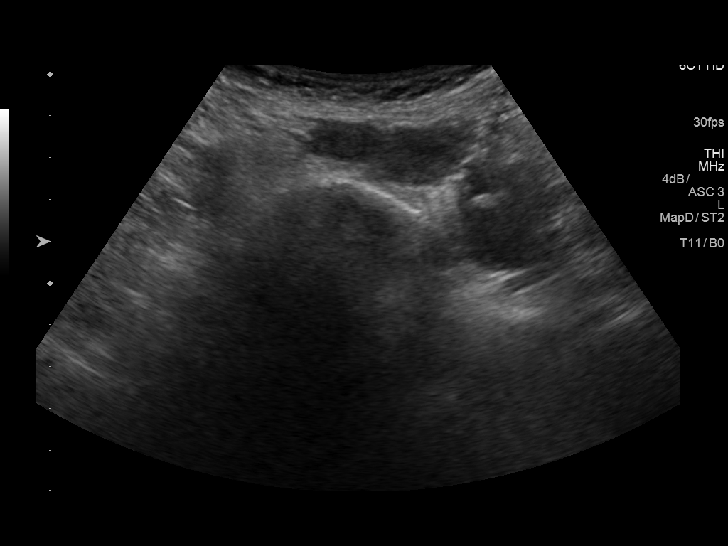
[im 21/82]
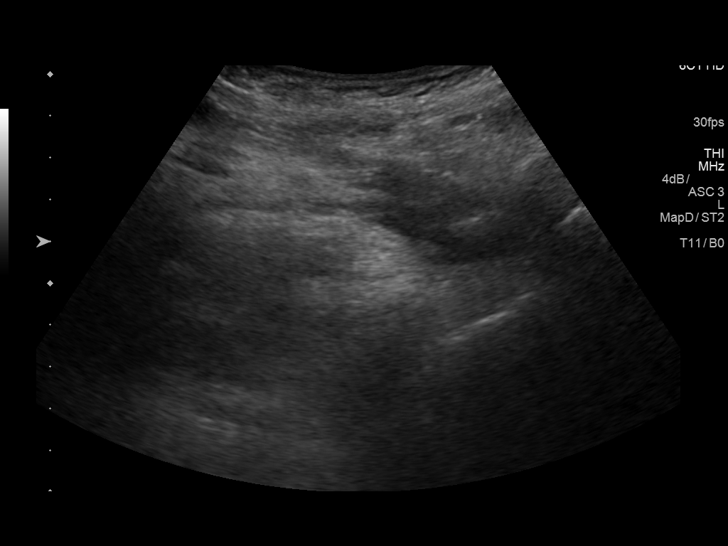
[im 28/82]
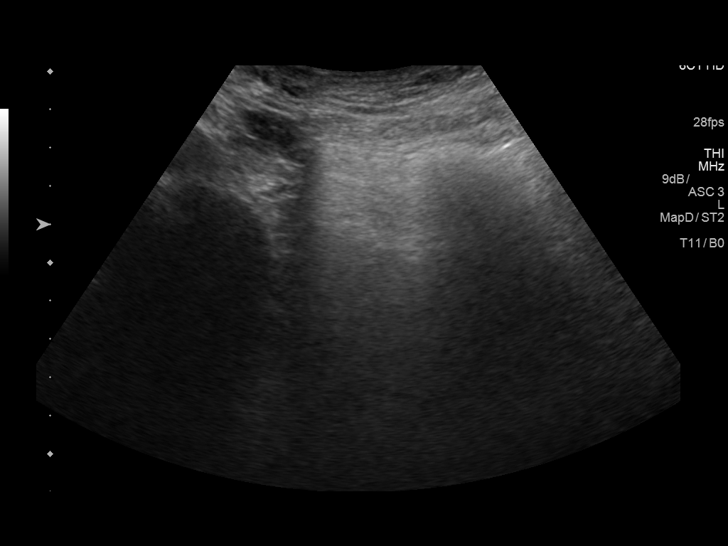
[im 31/82]
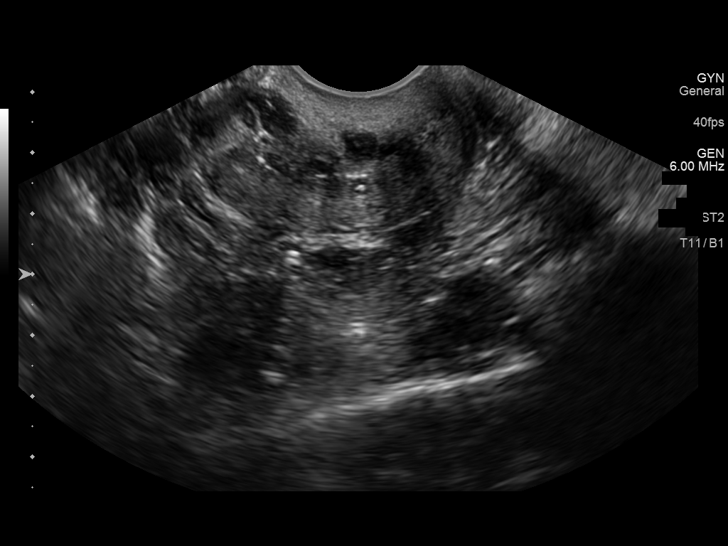
[im 38/82]
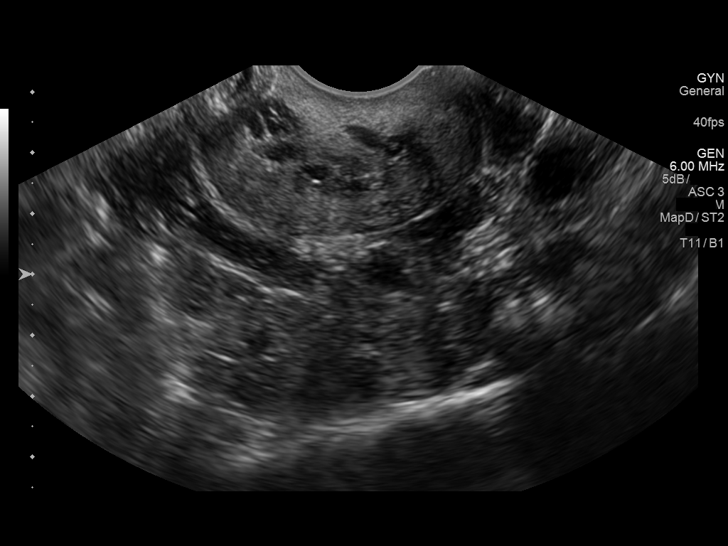
[im 44/82]
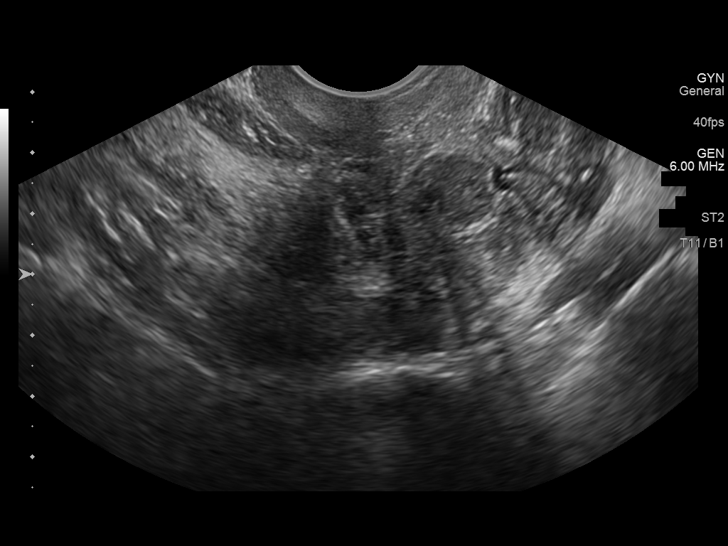
[im 51/82]
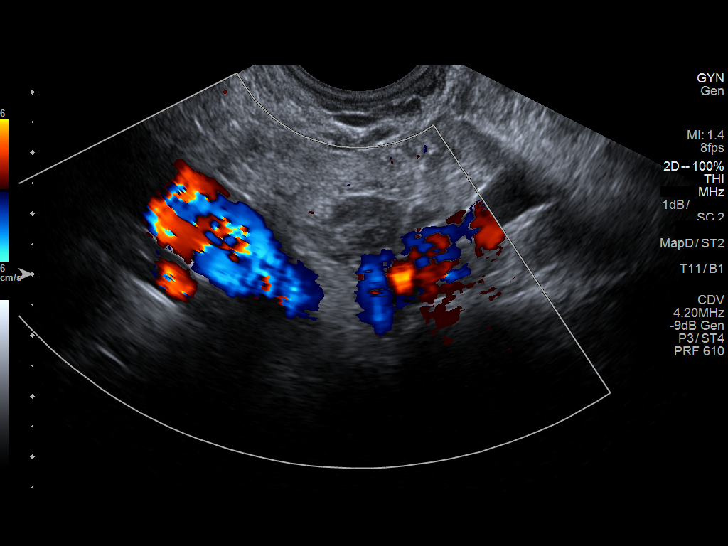
[im 55/82]
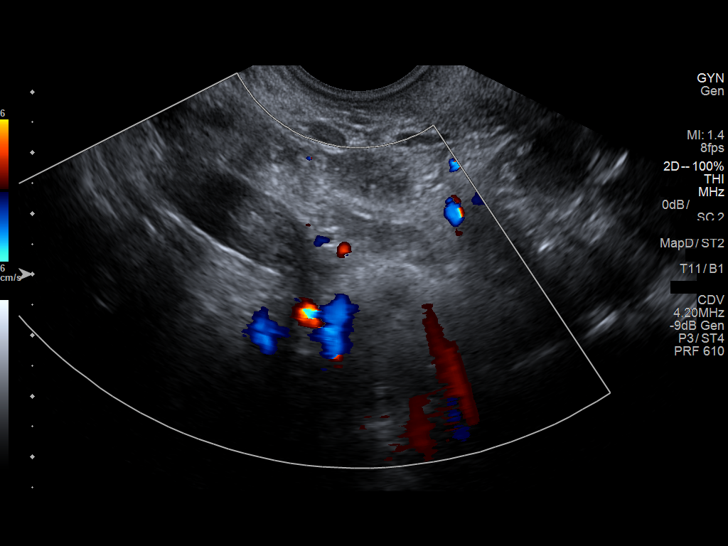
[im 61/82]
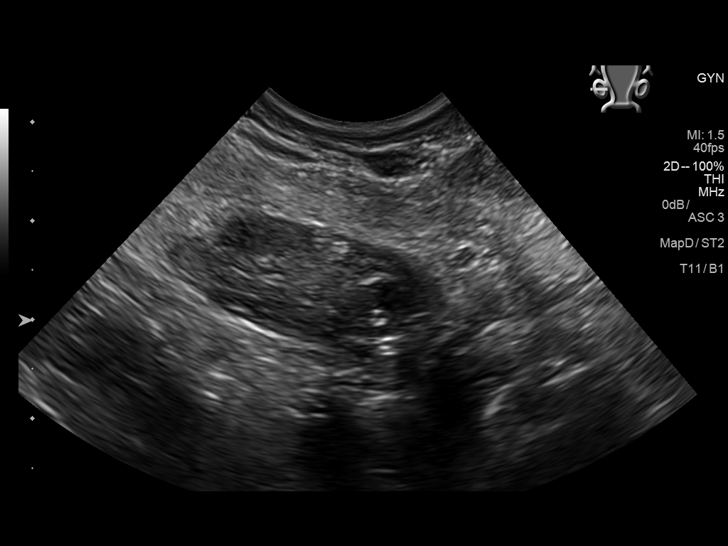
[im 68/82]
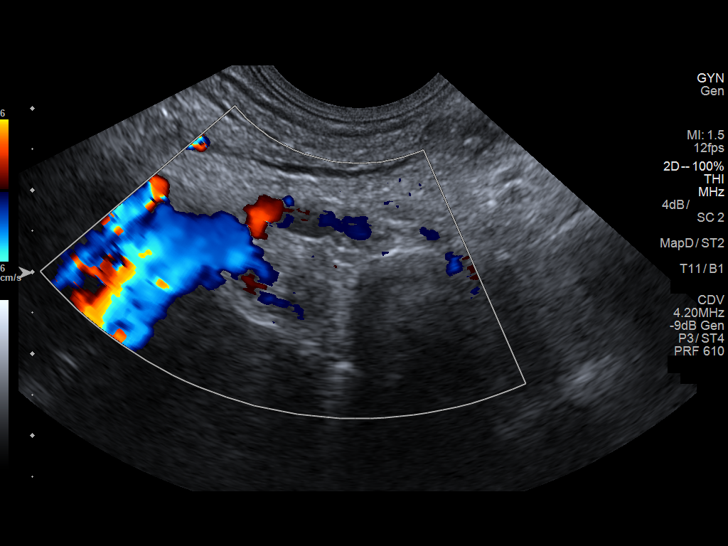
[im 75/82]
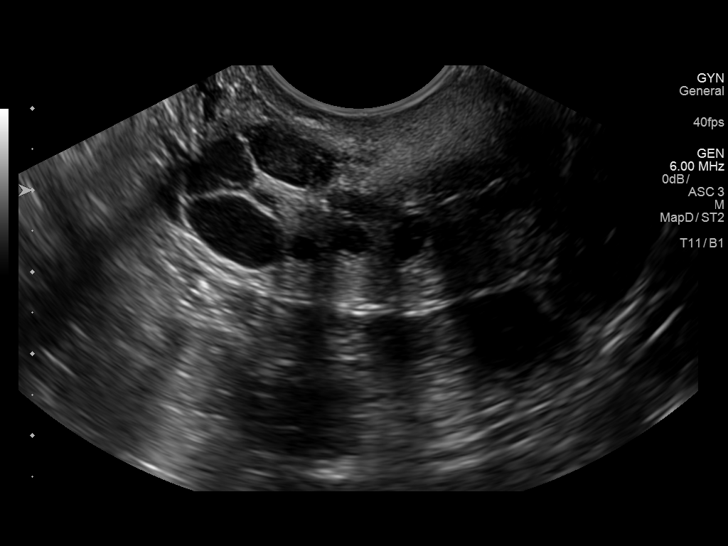
[im 82/82]
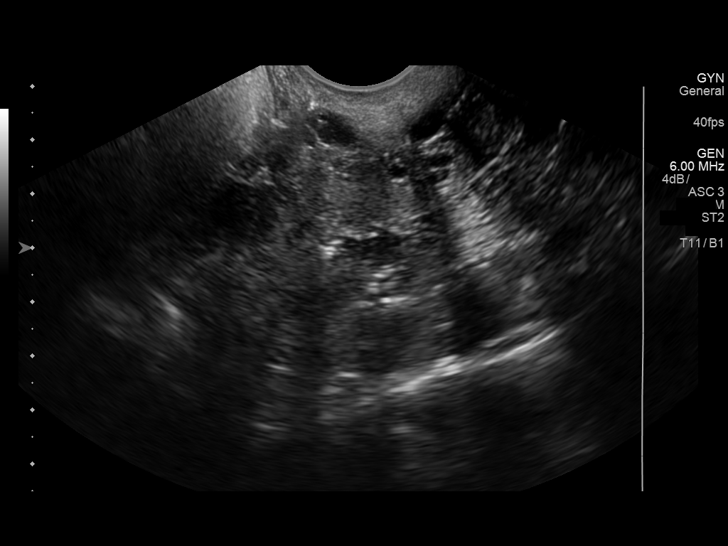

[14 of 25 positions shown; findings below may reference images not displayed]

FINDINGS: Uterus

Surgically absent.

Endometrium

Surgically absent.

Right ovary

Measurements: 2.4 x 1.1 x 1.8 cm. Normal appearance/no adnexal mass.

Left ovary

Not visualized.

Other findings

No abnormal free fluid.
IMPRESSION: Hysterectomy.  Left ovary not visualized.  No acute findings.

## 2017-07-29 DIAGNOSIS — I340 Nonrheumatic mitral (valve) insufficiency: Secondary | ICD-10-CM | POA: Insufficient documentation

## 2018-08-25 ENCOUNTER — Ambulatory Visit
Admission: RE | Admit: 2018-08-25 | Discharge: 2018-08-25 | Disposition: A | Discharge status: Home / Self Care | Payer: BLUE CROSS/BLUE SHIELD | Source: Ambulatory Visit | Attending: Family Medicine | Admitting: Family Medicine

## 2018-08-25 ENCOUNTER — Other Ambulatory Visit: Payer: Self-pay | Admitting: Family Medicine

## 2018-08-25 ENCOUNTER — Other Ambulatory Visit
Admission: RE | Admit: 2018-08-25 | Discharge: 2018-08-25 | Disposition: A | Discharge status: Home / Self Care | Payer: BLUE CROSS/BLUE SHIELD | Source: Ambulatory Visit | Attending: Family Medicine | Admitting: Family Medicine

## 2018-08-25 DIAGNOSIS — R198 Other specified symptoms and signs involving the digestive system and abdomen: Secondary | ICD-10-CM

## 2018-08-25 DIAGNOSIS — R110 Nausea: Secondary | ICD-10-CM

## 2018-08-25 LAB — TROPONIN I: Troponin I: <0.03 ng/mL (ref <0.03)

## 2018-08-25 IMAGING — US US ABDOMEN COMPLETE
1 series · 14 of 25 positions shown · non-contrast
Comparison: Ultrasound abdomen 09/29/2012

CLINICAL DATA: Cholecystectomy.  Pulsatile abdomen.  Nausea.

EXAM:
ABDOMEN ULTRASOUND COMPLETE

[Series 1: us abdomen complete · 0.22mm/px · 14 of 90 slices shown]
[im 1/90]
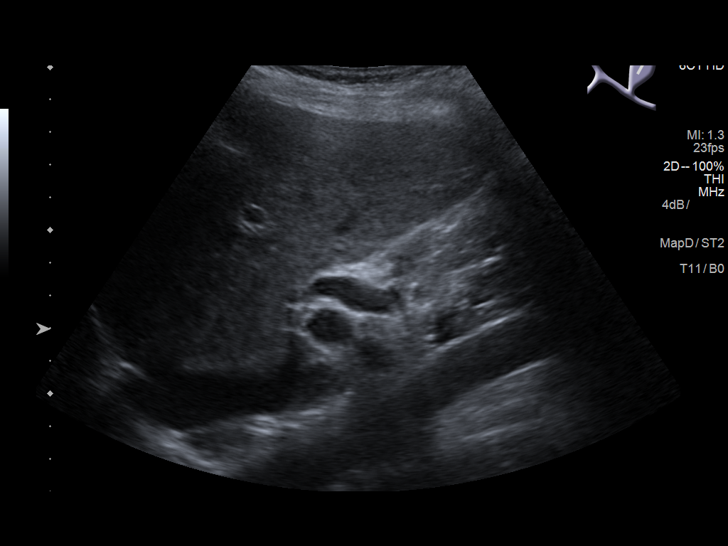
[im 8/90]
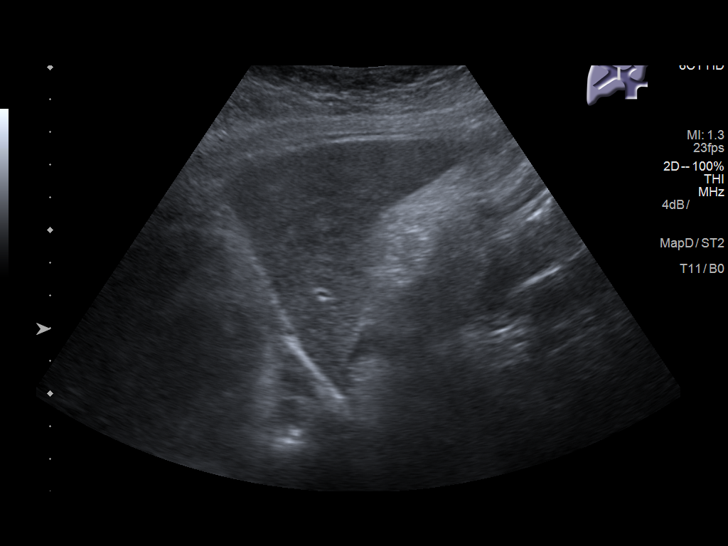
[im 15/90]
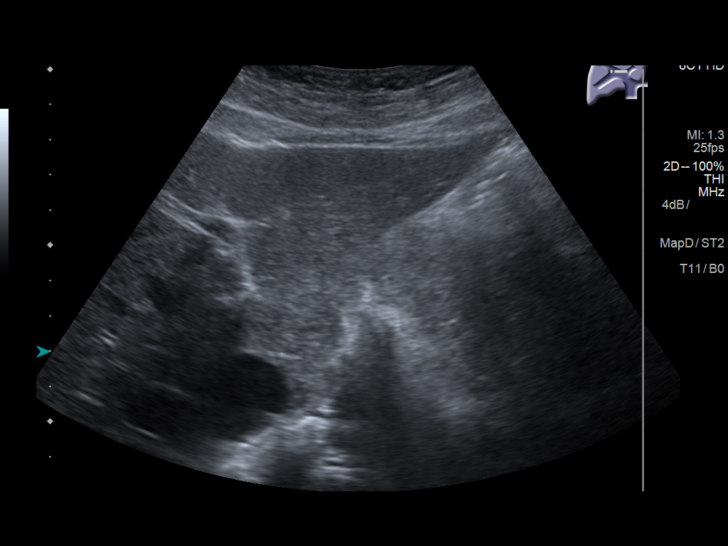
[im 23/90]
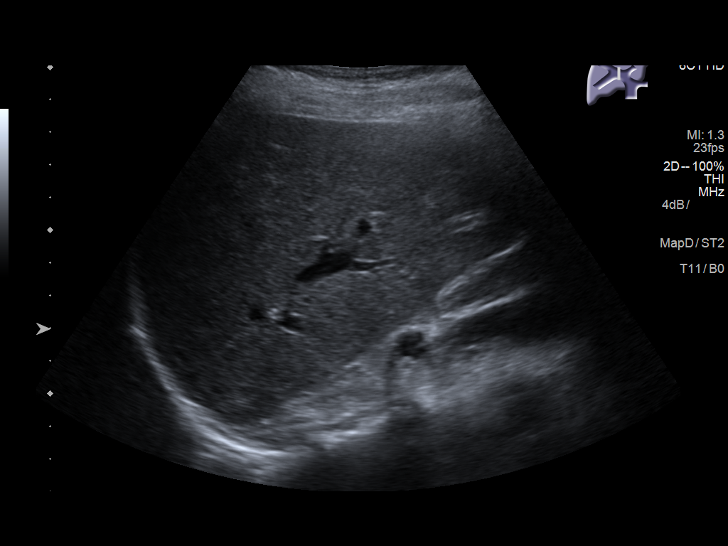
[im 30/90]
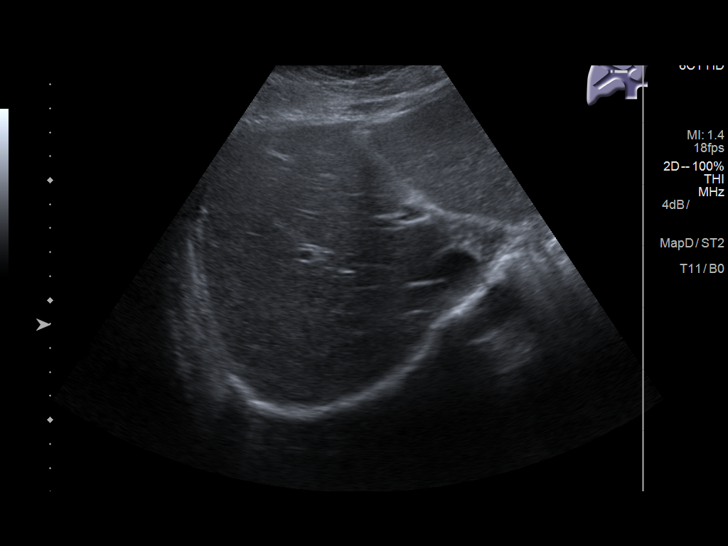
[im 34/90]
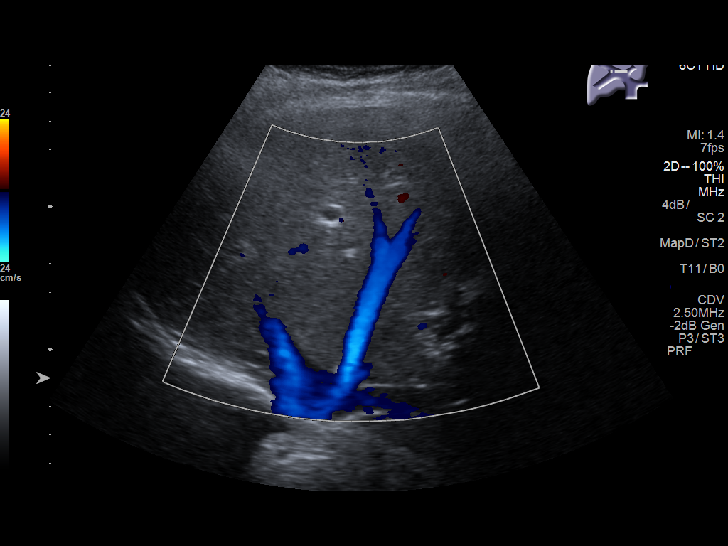
[im 41/90]
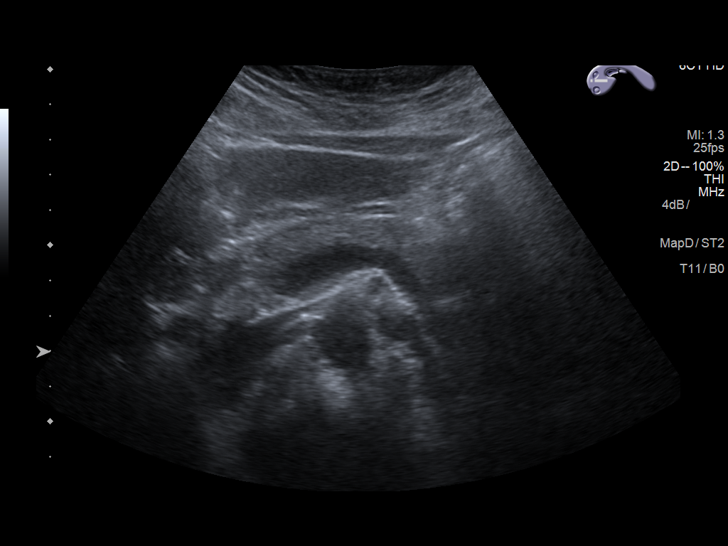
[im 49/90]
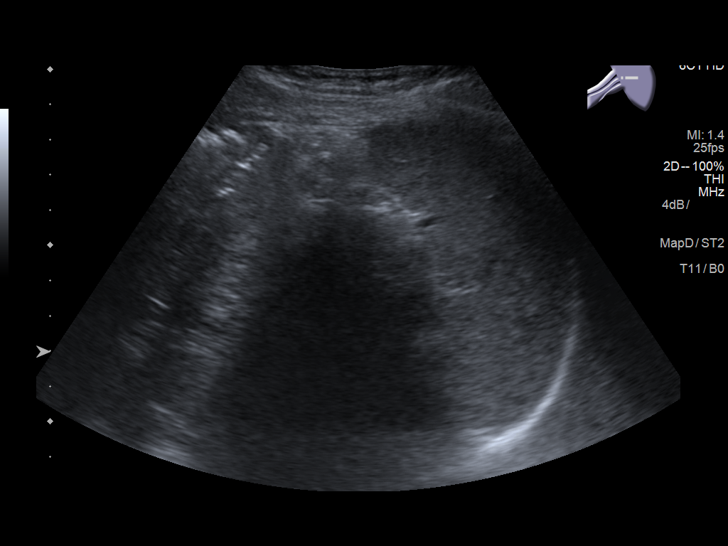
[im 56/90]
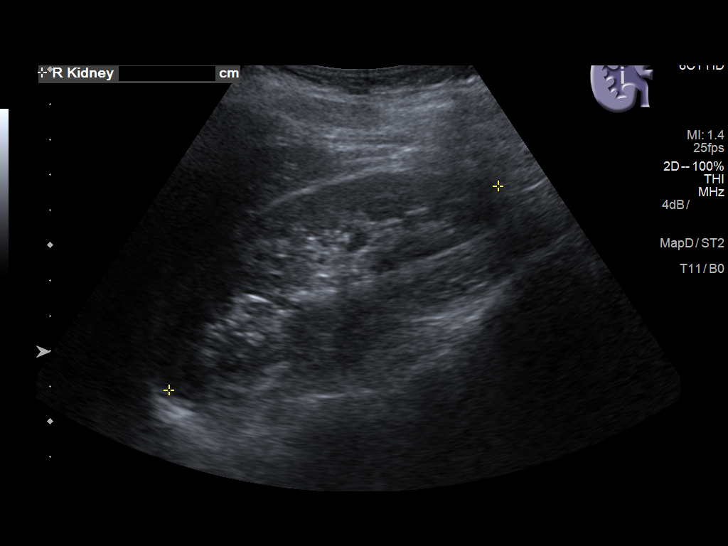
[im 60/90]
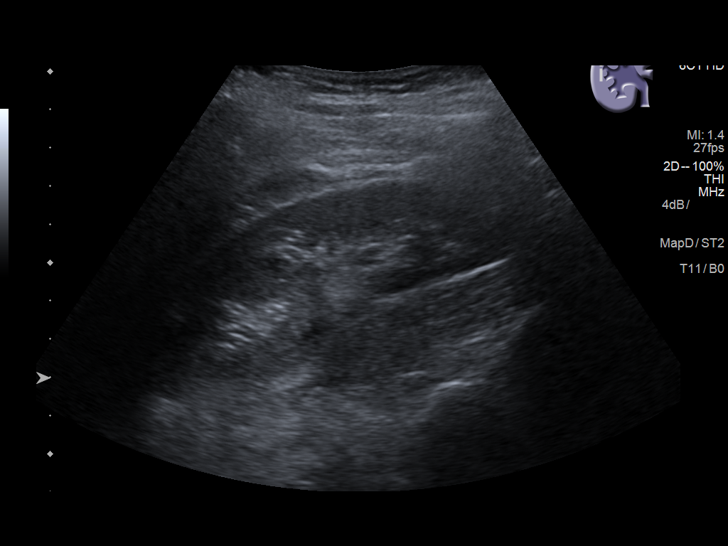
[im 67/90]
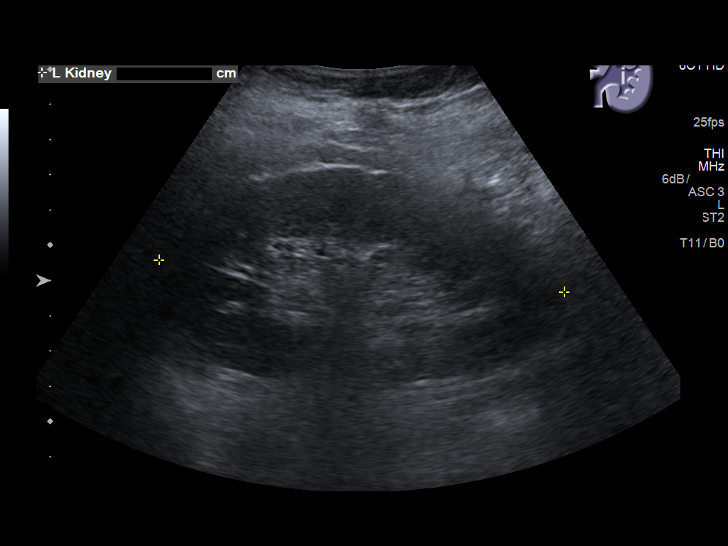
[im 75/90]
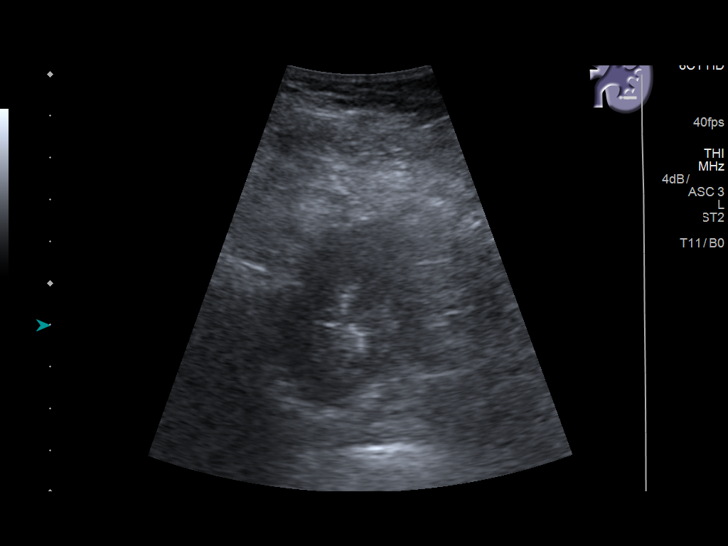
[im 82/90]
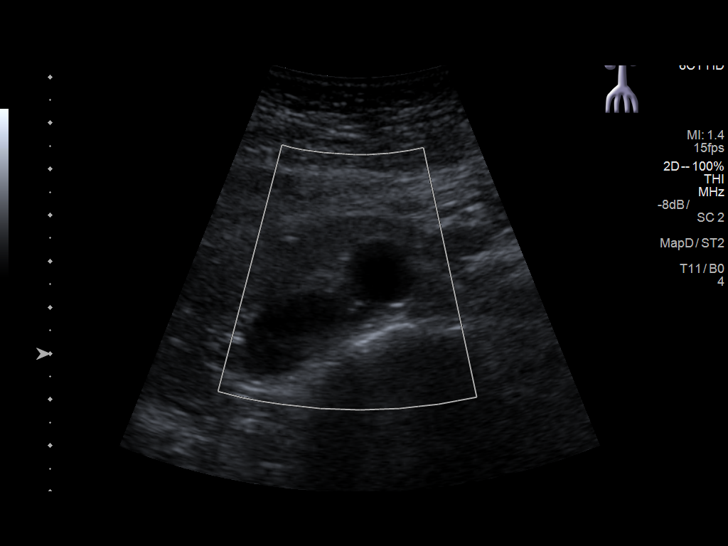
[im 90/90]
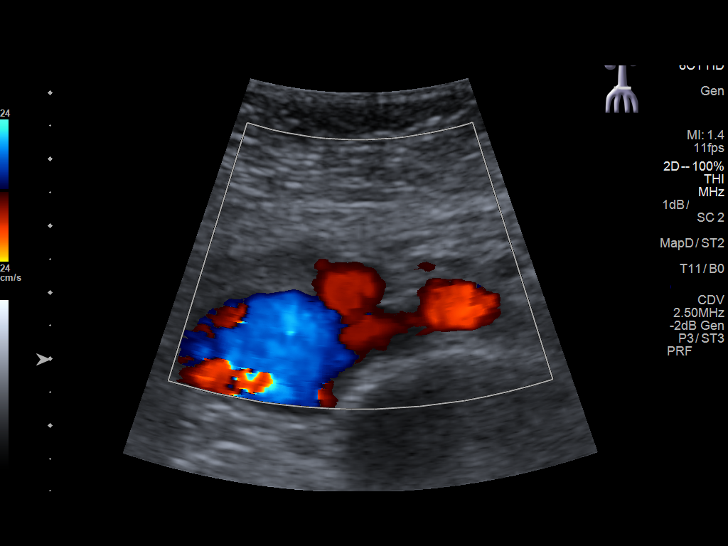

[14 of 25 positions shown; findings below may reference images not displayed]

FINDINGS: Gallbladder: Surgically absent

Common bile duct: Diameter: 8 mm, within normal limits for
cholecystectomy

Liver: No focal lesion identified. Within normal limits in
parenchymal echogenicity. Portal vein is patent on color Doppler
imaging with normal direction of blood flow towards the liver.

IVC: No abnormality visualized.

Pancreas: Visualized portion unremarkable.

Spleen: Size and appearance within normal limits.

Right Kidney: Length: 11.0 cm. Echogenicity within normal limits. No
mass or hydronephrosis visualized.

Left Kidney: Length: 11.5 cm. Echogenicity within normal limits. No
mass or hydronephrosis visualized.

Abdominal aorta: No aneurysm visualized.

Other findings: None.
IMPRESSION: Postop cholecystectomy.  No acute abnormality.

## 2018-10-01 ENCOUNTER — Other Ambulatory Visit: Payer: Self-pay | Admitting: Gastroenterology

## 2018-10-01 ENCOUNTER — Other Ambulatory Visit (HOSPITAL_COMMUNITY): Payer: Self-pay | Admitting: Gastroenterology

## 2018-10-01 DIAGNOSIS — R17 Unspecified jaundice: Secondary | ICD-10-CM

## 2018-10-01 DIAGNOSIS — R109 Unspecified abdominal pain: Secondary | ICD-10-CM

## 2018-10-05 ENCOUNTER — Ambulatory Visit
Admission: RE | Admit: 2018-10-05 | Discharge: 2018-10-05 | Disposition: A | Discharge status: Home / Self Care | Payer: BLUE CROSS/BLUE SHIELD | Source: Ambulatory Visit | Attending: Gastroenterology | Admitting: Gastroenterology

## 2018-10-05 DIAGNOSIS — R109 Unspecified abdominal pain: Secondary | ICD-10-CM | POA: Diagnosis not present

## 2018-10-05 DIAGNOSIS — R17 Unspecified jaundice: Secondary | ICD-10-CM | POA: Insufficient documentation

## 2018-10-05 IMAGING — CT CT ABD-PELV W/ CM
2 of 5 series · 15 of 46 positions shown, 17 images · IV contrast (iopamidol)
Comparison: None

CLINICAL DATA: Right upper quadrant pain

EXAM:
CT ABDOMEN AND PELVIS WITH CONTRAST
TECHNIQUE: Multidetector CT imaging of the abdomen and pelvis was performed
using the standard protocol following bolus administration of
intravenous contrast.
CONTRAST:  85mL [id number] IOPAMIDOL ([id number]) INJECTION 61%

[Series 2: abd pelvis · axial · 0.64mm/px · z∈[-1623,-1223]mm · 12 of 90 slices shown, 14 images (1 of 2)]
[im 5/90  soft-tissue]
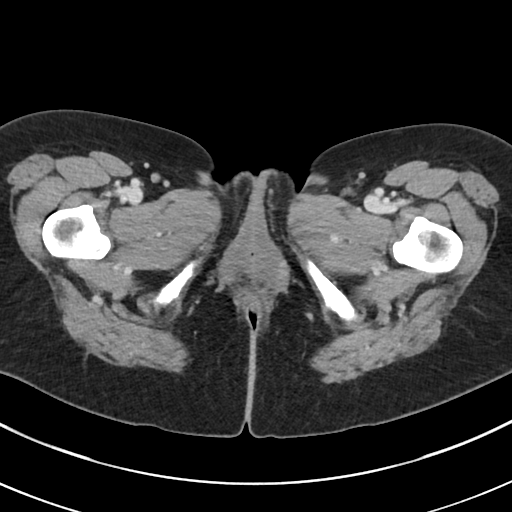
[im 5/90  bone]
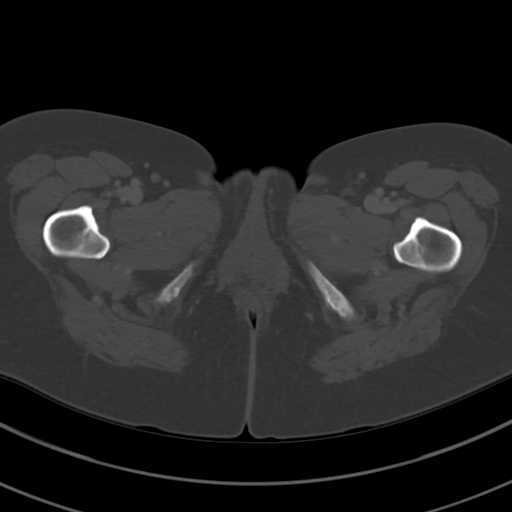
[im 15/90  soft-tissue]
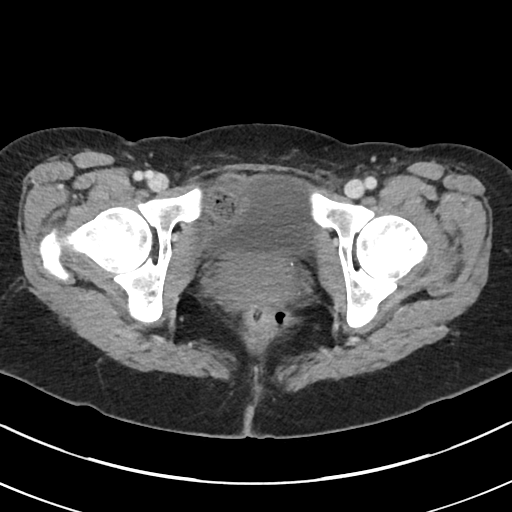
[im 20/90  soft-tissue]
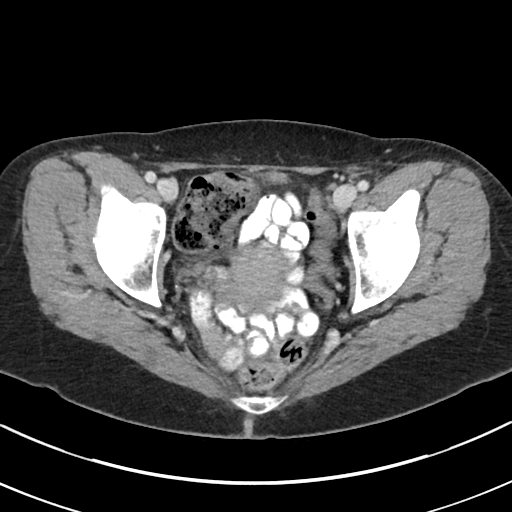
[im 25/90  soft-tissue]
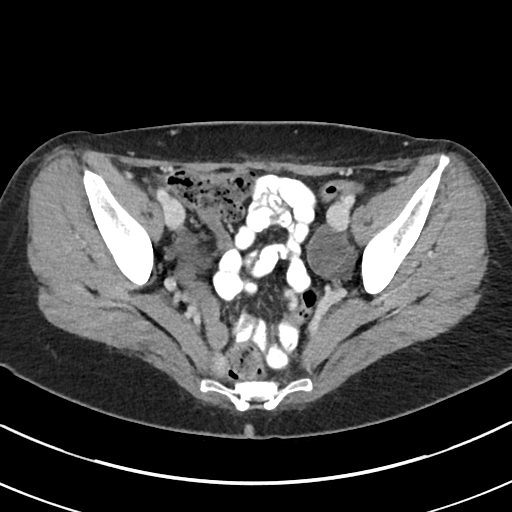
[im 35/90  soft-tissue]
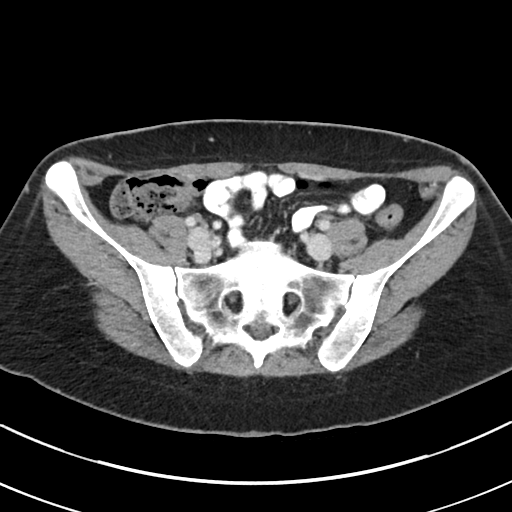
[im 40/90  soft-tissue]
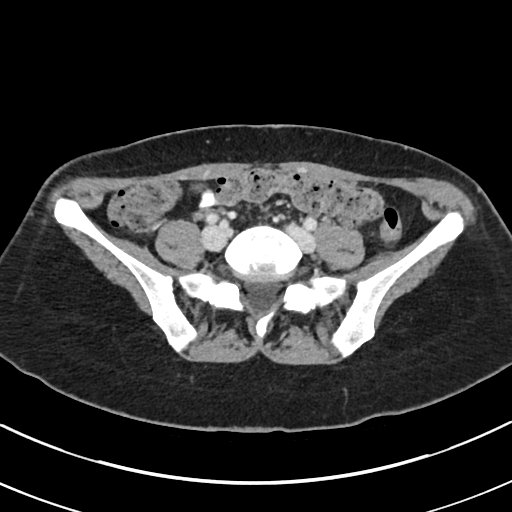
[im 50/90  soft-tissue]
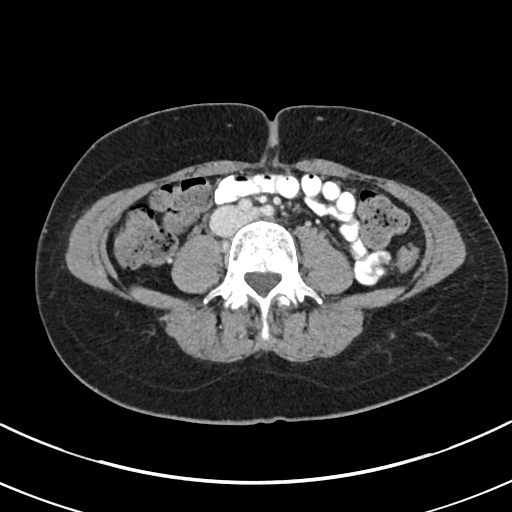
[im 55/90  soft-tissue]
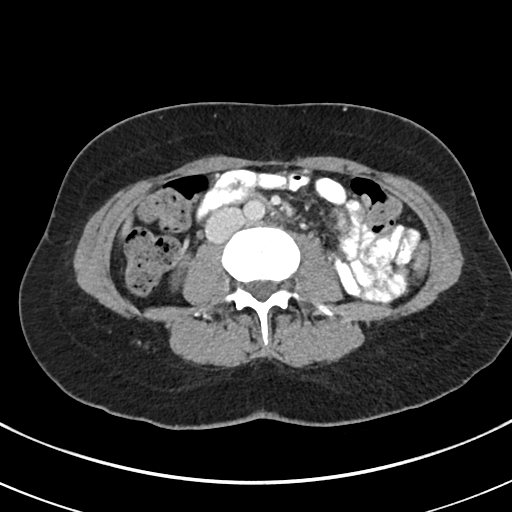
[im 65/90  soft-tissue]
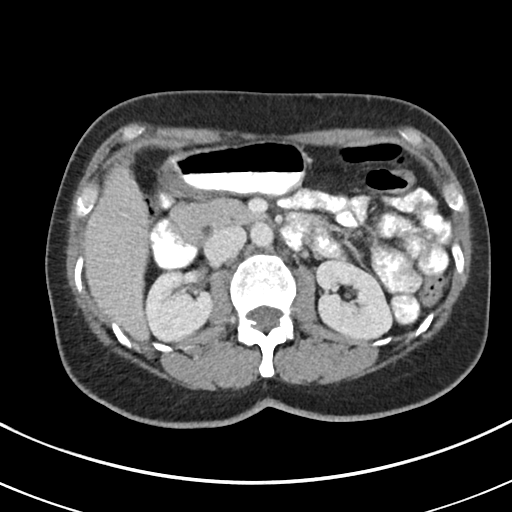
[im 65/90  bone]
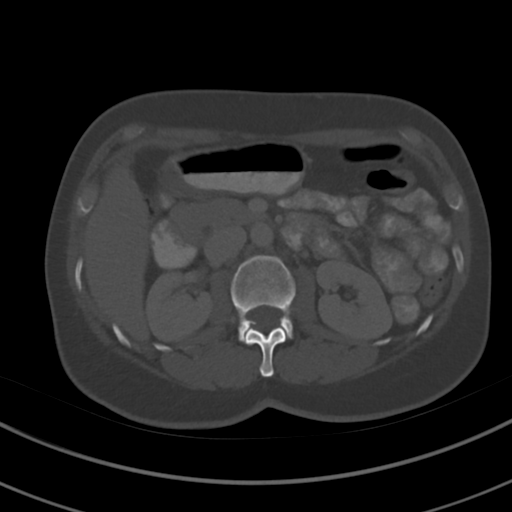
[im 70/90  soft-tissue]
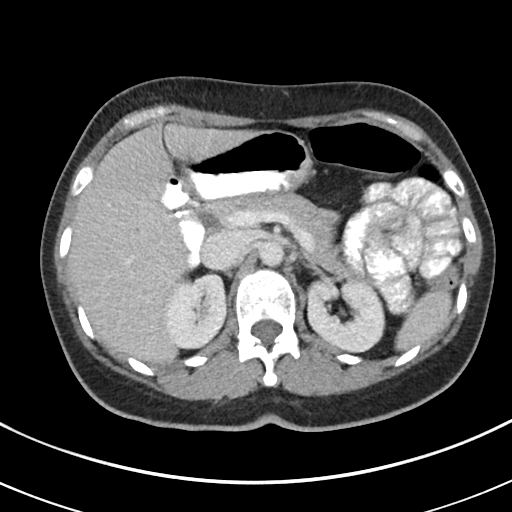
[im 75/90  soft-tissue]
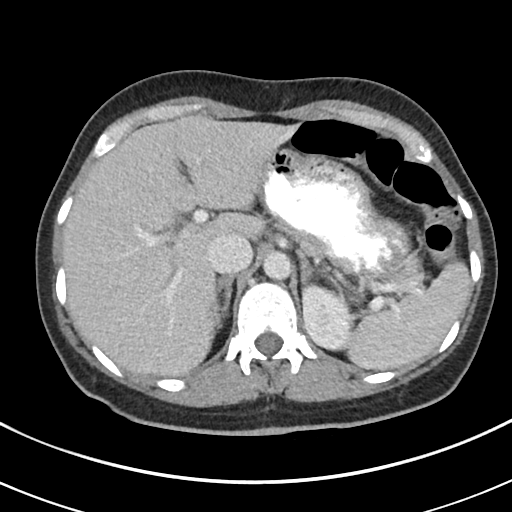
[im 85/90  soft-tissue]
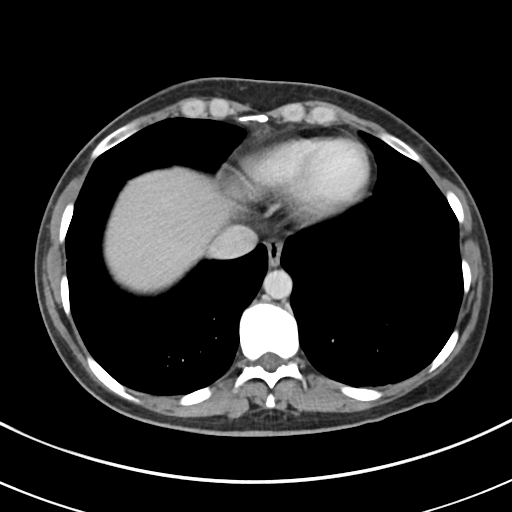

[Series 4: abd pelvis · coronal · 0.64mm/px · 3 of 121 slices shown (2 of 2)]
[im 41/121  soft-tissue]
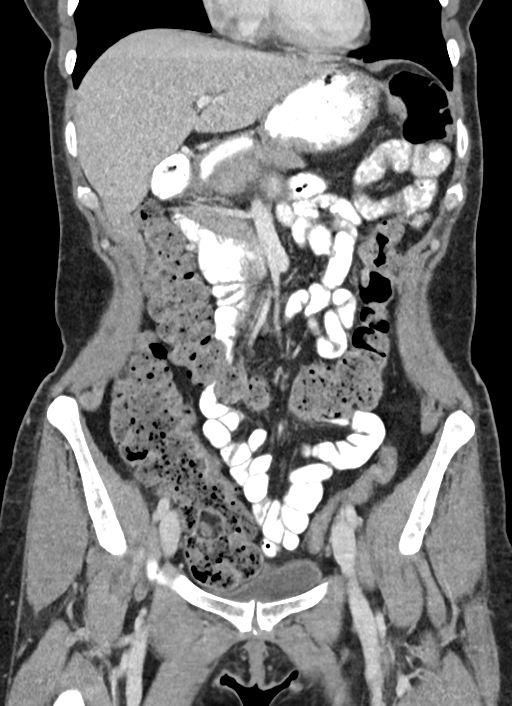
[im 54/121  soft-tissue]
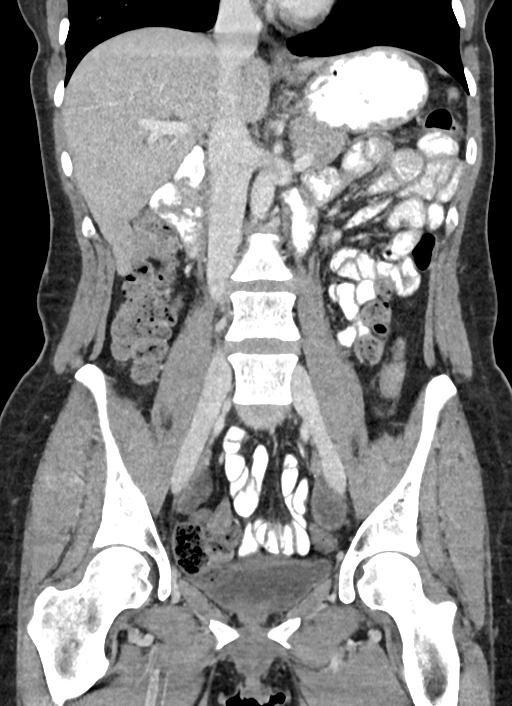
[im 67/121  soft-tissue]
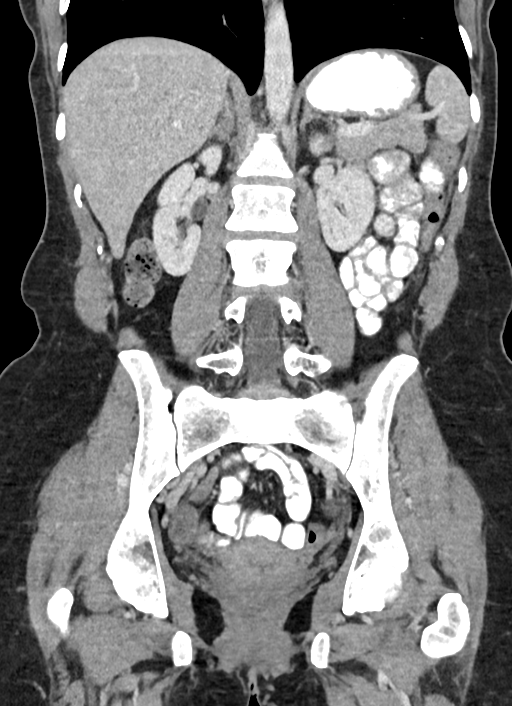

[15 of 46 positions shown; findings below may reference images not displayed]

FINDINGS: Lower chest: No acute abnormality.

Hepatobiliary: 6 mm low-density structure within right lobe of liver
is noted, too small to characterize, image [DATE]. previous
cholecystectomy. Fusiform dilatation of the common bile duct
measures up to 1.3 cm. No CT findings to suggest
choledocholithiasis.

Pancreas: Unremarkable. No pancreatic ductal dilatation or
surrounding inflammatory changes.

Spleen: Normal in size without focal abnormality.

Adrenals/Urinary Tract: Normal adrenal glands. Low-density structure
within the medial cortex of the left kidney is too small to
characterize measuring 5 mm. No enhancing mass or hydronephrosis
identified bilaterally. The urinary bladder appears normal.

Stomach/Bowel: Stomach is normal. No bowel wall thickening or
inflammation. No abnormal dilatation of the small or large bowel
loops. Unremarkable appearance of the colon.

Vascular/Lymphatic: No significant vascular findings are present. No
enlarged abdominal or pelvic lymph nodes.

Reproductive: The uterus is unremarkable. Bilateral ovary cysts are
identified. The largest is on the left measuring 2.9 cm, image 66/2.

Other: No free fluid or fluid collections.

Musculoskeletal: No acute or significant osseous findings.
IMPRESSION: 1. No acute findings identified.
2. Fusiform dilatation of the common bile duct status post
cholecystectomy. No CT findings to suggest choledocholithiasis. If
there is a high clinical suspicion for choledocholithiasis consider
further evaluation with MRCP or ERCP.

## 2018-10-05 MED ORDER — IOPAMIDOL (ISOVUE-300) INJECTION 61%
85.0000 mL | Freq: Once | INTRAVENOUS | Status: AC | PRN
  Administered 2018-10-05: 85 mL via INTRAVENOUS

## 2019-12-15 ENCOUNTER — Other Ambulatory Visit: Payer: Self-pay | Admitting: Family Medicine

## 2019-12-15 ENCOUNTER — Ambulatory Visit
Admission: RE | Admit: 2019-12-15 | Discharge: 2019-12-15 | Disposition: A | Discharge status: Home / Self Care | Payer: BC Managed Care – PPO | Source: Ambulatory Visit | Attending: Family Medicine | Admitting: Family Medicine

## 2019-12-15 ENCOUNTER — Other Ambulatory Visit: Payer: Self-pay

## 2019-12-15 DIAGNOSIS — M79661 Pain in right lower leg: Secondary | ICD-10-CM

## 2019-12-15 DIAGNOSIS — M7989 Other specified soft tissue disorders: Secondary | ICD-10-CM

## 2019-12-15 IMAGING — US US EXTREM LOW VENOUS*R*
1 series · 13 of 24 positions shown · non-contrast
Comparison: None.

CLINICAL DATA: Right lower extremity pain and edema.



[Series 1: us venous img lower uni right (dvt) · portal-venous · 13 of 27 slices shown]
[im 1/27]
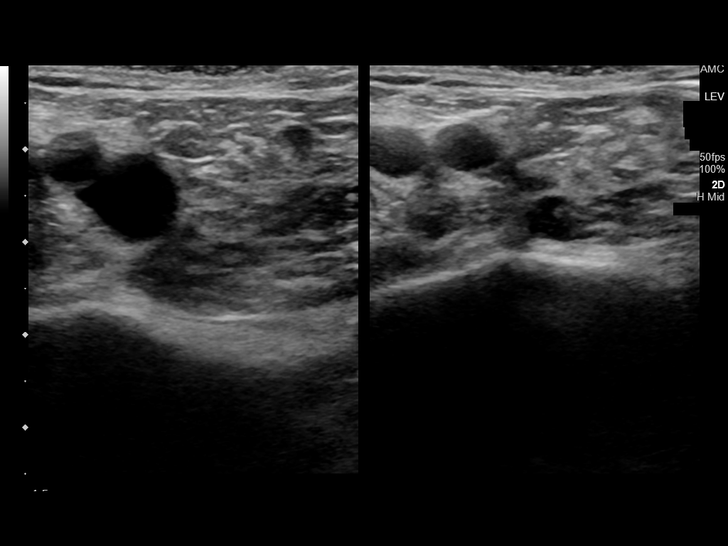
[im 3/27]
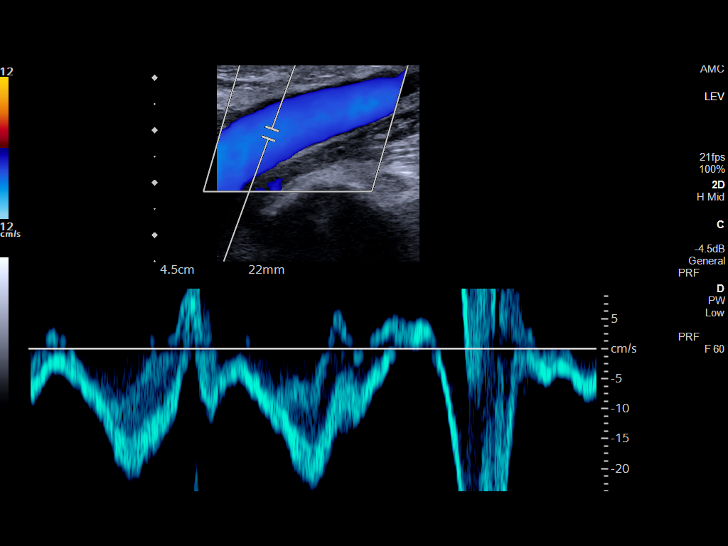
[im 5/27]
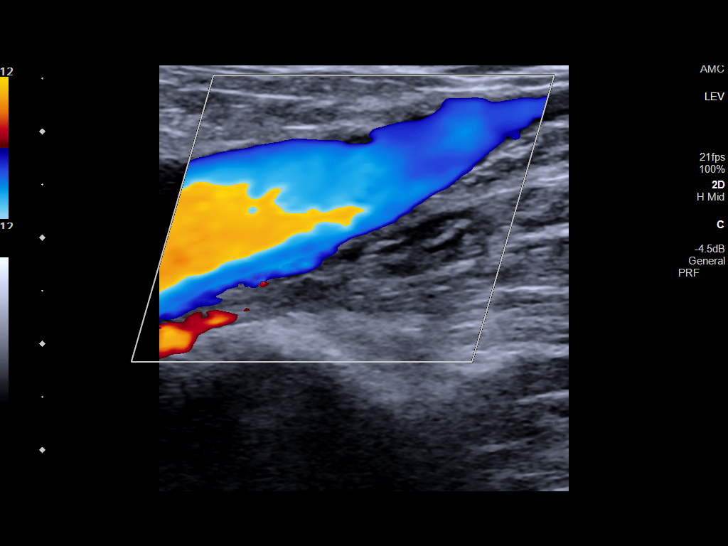
[im 7/27]
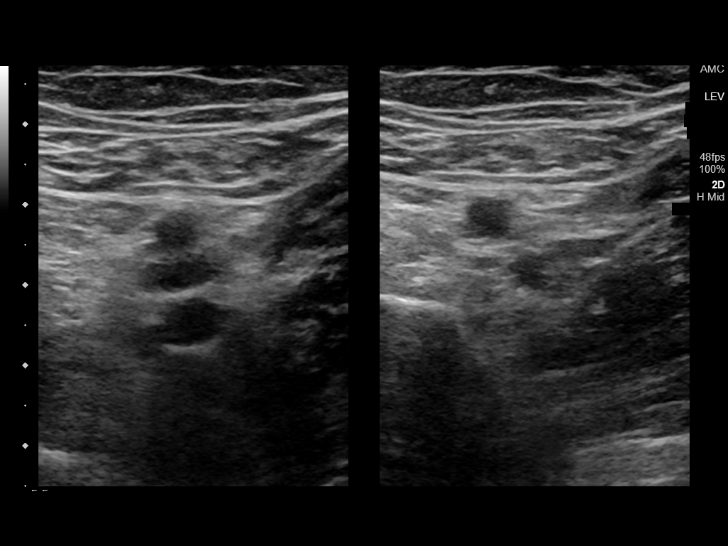
[im 10/27]
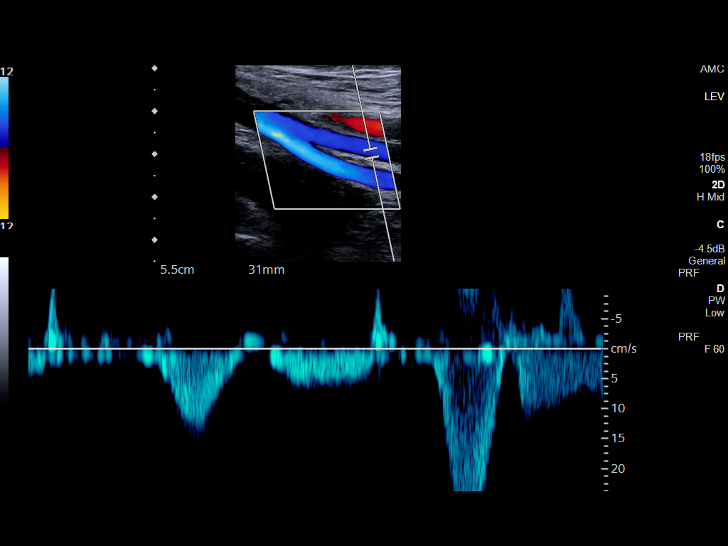
[im 12/27]
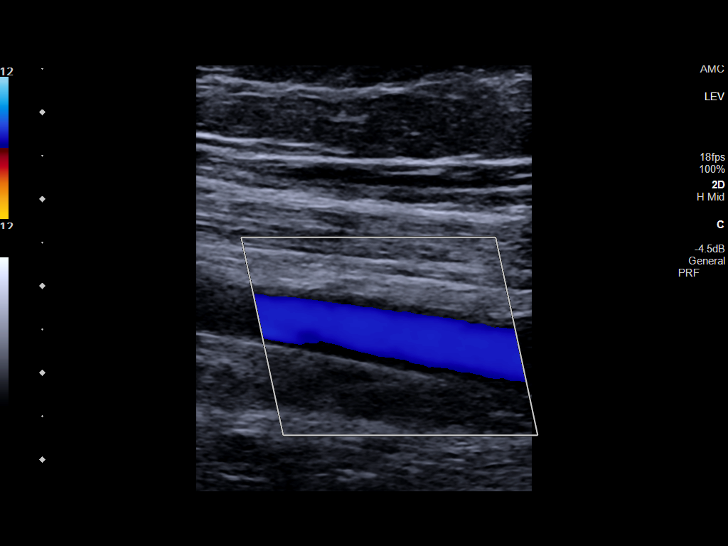
[im 14/27]
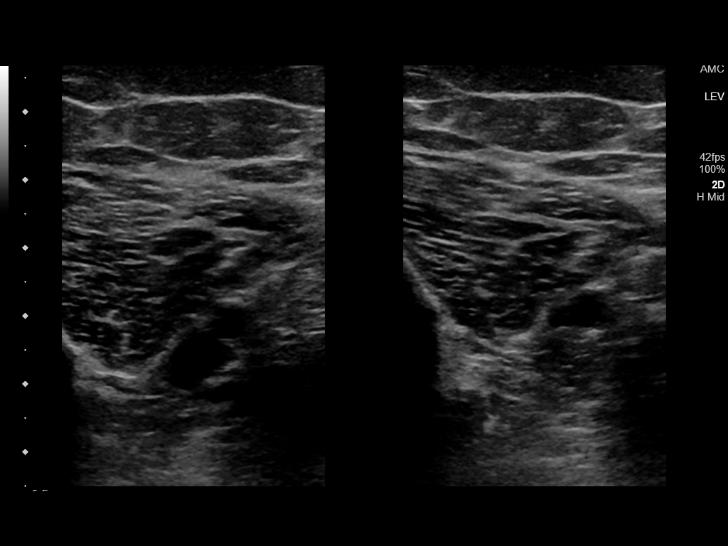
[im 15/27]
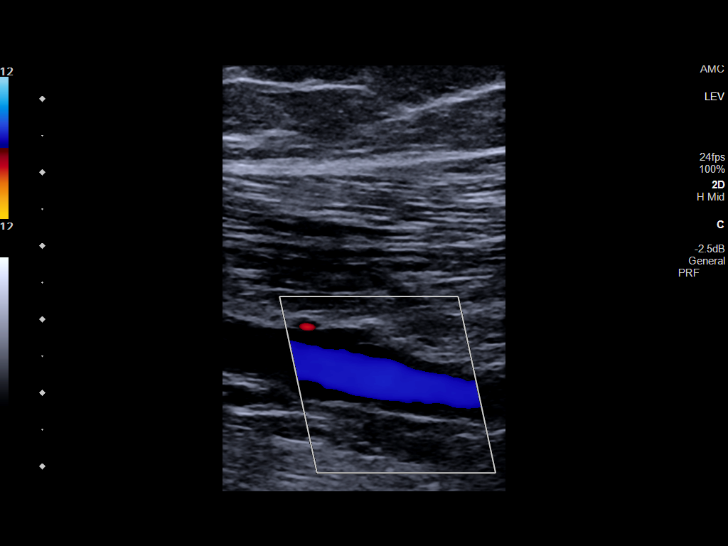
[im 17/27]
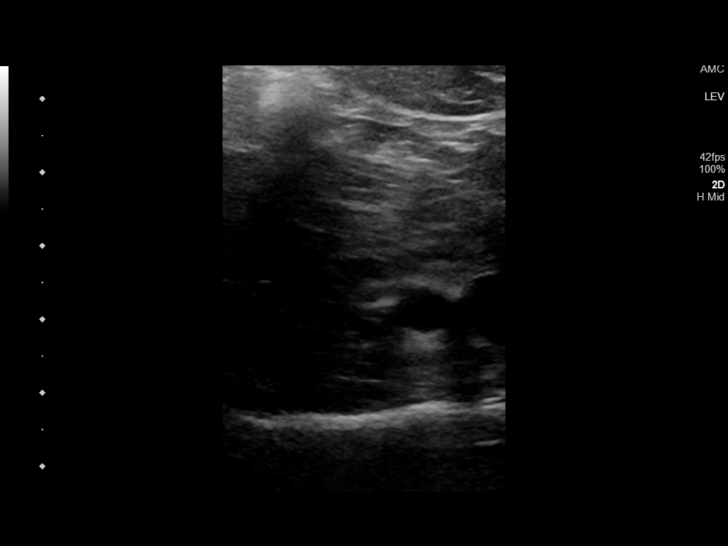
[im 20/27]
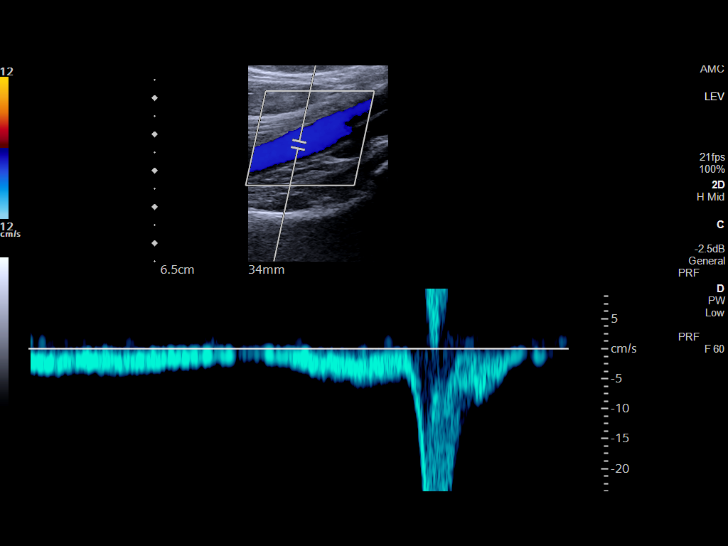
[im 22/27]
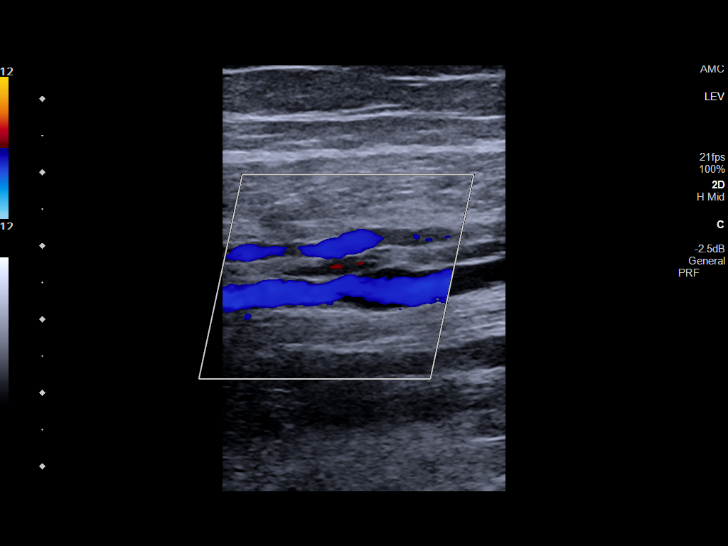
[im 24/27]
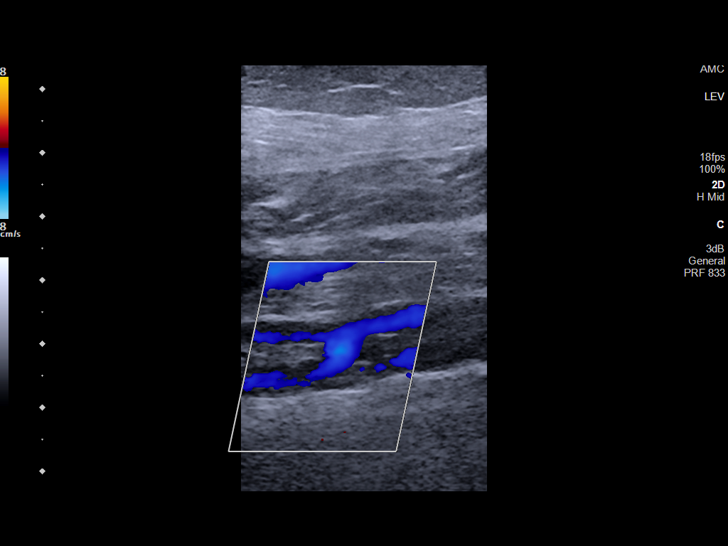
[im 27/27]
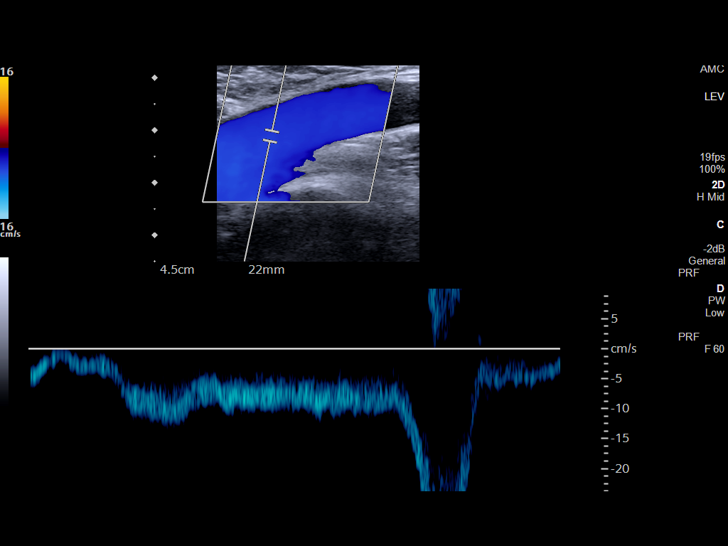

[13 of 24 positions shown; findings below may reference images not displayed]

FINDINGS: Contralateral Common Femoral Vein: Respiratory phasicity is normal
and symmetric with the symptomatic side. No evidence of thrombus.
Normal compressibility.

Common Femoral Vein: No evidence of thrombus. Normal
compressibility, respiratory phasicity and response to augmentation.

Saphenofemoral Junction: No evidence of thrombus. Normal
compressibility and flow on color Doppler imaging.

Profunda Femoral Vein: No evidence of thrombus. Normal
compressibility and flow on color Doppler imaging.

Femoral Vein: No evidence of thrombus. Normal compressibility,
respiratory phasicity and response to augmentation.

Popliteal Vein: No evidence of thrombus. Normal compressibility,
respiratory phasicity and response to augmentation.

Calf Veins: No evidence of thrombus. Normal compressibility and flow
on color Doppler imaging.

Superficial Great Saphenous Vein: No evidence of thrombus. Normal
compressibility.

Venous Reflux:  None.

Other Findings: No evidence of superficial thrombophlebitis or
abnormal fluid collection.
IMPRESSION: No evidence of right lower extremity deep venous thrombosis.

## 2019-12-16 ENCOUNTER — Other Ambulatory Visit: Payer: Self-pay

## 2019-12-16 ENCOUNTER — Ambulatory Visit
Admission: RE | Admit: 2019-12-16 | Discharge: 2019-12-16 | Disposition: A | Discharge status: Home / Self Care | Payer: BC Managed Care – PPO | Source: Ambulatory Visit | Attending: Family Medicine | Admitting: Family Medicine

## 2019-12-16 ENCOUNTER — Other Ambulatory Visit: Payer: Self-pay | Admitting: Family Medicine

## 2019-12-16 DIAGNOSIS — R42 Dizziness and giddiness: Secondary | ICD-10-CM | POA: Diagnosis not present

## 2019-12-16 DIAGNOSIS — R000 Tachycardia, unspecified: Secondary | ICD-10-CM

## 2019-12-16 LAB — POCT I-STAT CREATININE: Creatinine, Ser: 0.6 mg/dL (ref 0.44–1.00)

## 2019-12-16 IMAGING — CT CT ANGIO CHEST
2 of 7 series · 13 of 36 positions shown · IV contrast (omnipaque)
Comparison: None.

CLINICAL DATA: Tachycardia and dizziness

EXAM:
CT ANGIOGRAPHY CHEST WITH CONTRAST
TECHNIQUE: Multidetector CT imaging of the chest was performed using the
standard protocol during bolus administration of intravenous
contrast. Multiplanar CT image reconstructions and MIPs were
obtained to evaluate the vascular anatomy.
CONTRAST:  75mL OMNIPAQUE IOHEXOL 350 MG/ML SOLN

[Series 6: thins cta pulmonary 1.00 · axial · 0.56mm/px · z∈[-1166,-915]mm · 12 of 297 slices shown]
[im 23/297  lung]
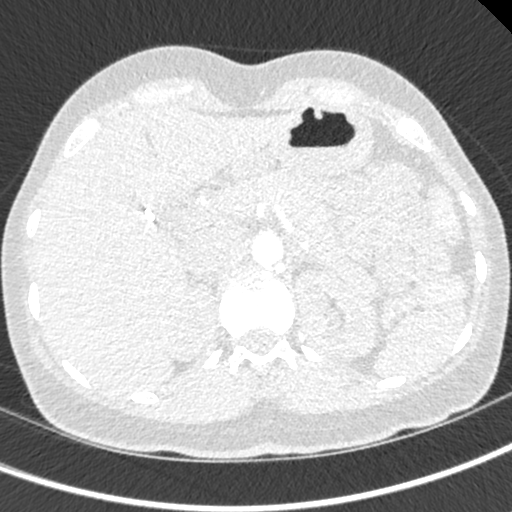
[im 46/297  mediastinal]
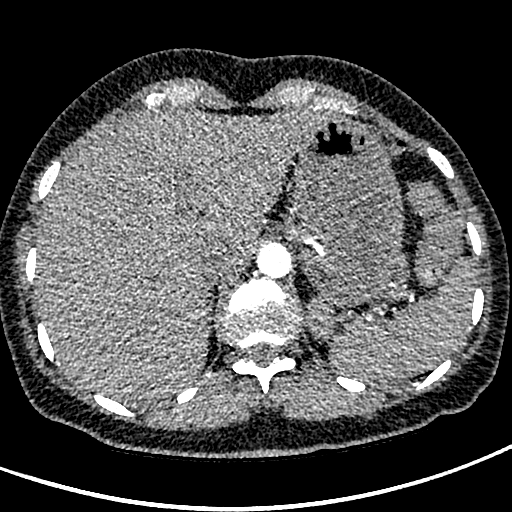
[im 69/297  lung]
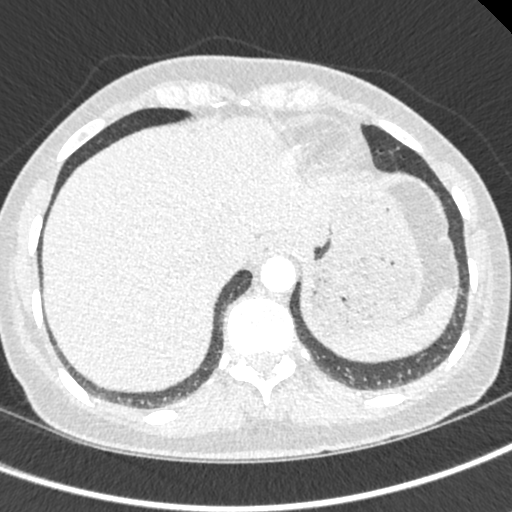
[im 92/297  mediastinal]
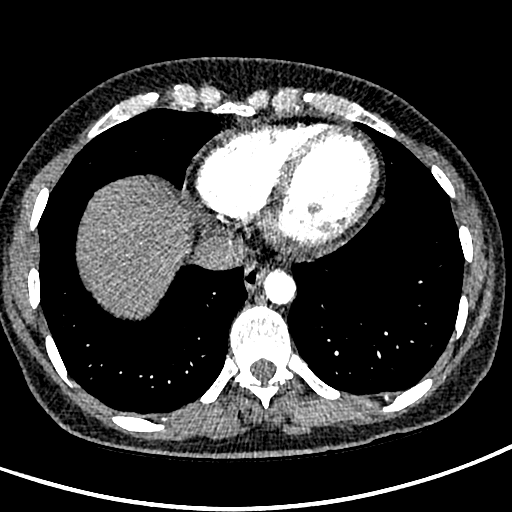
[im 114/297  lung]
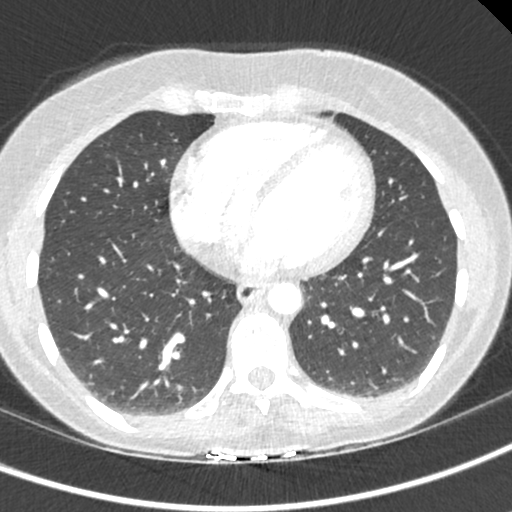
[im 137/297  mediastinal]
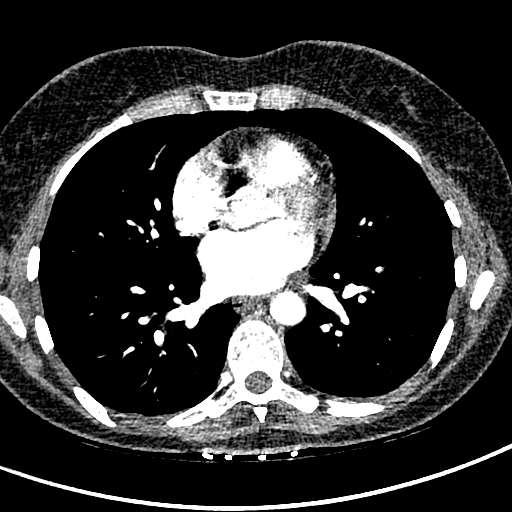
[im 160/297  lung]
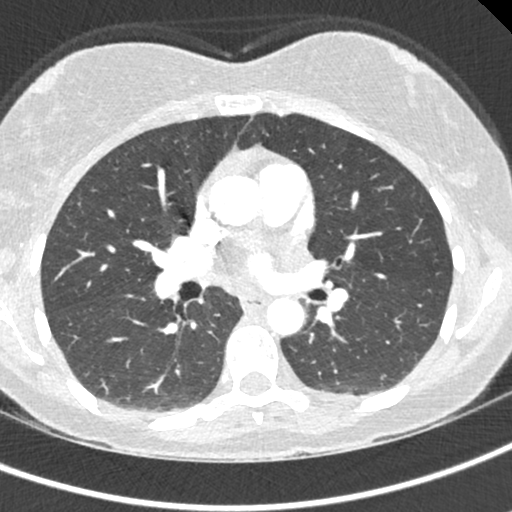
[im 183/297  mediastinal]
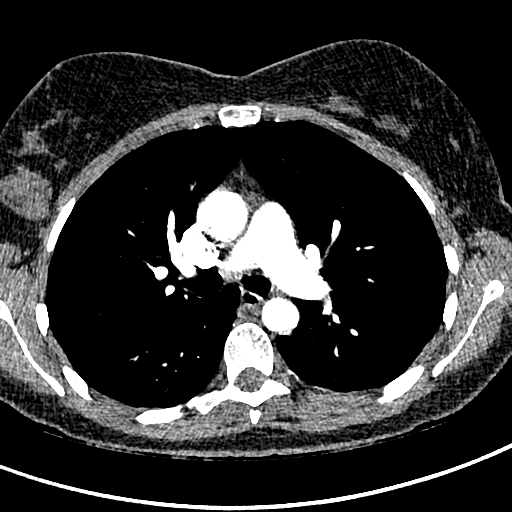
[im 205/297  lung]
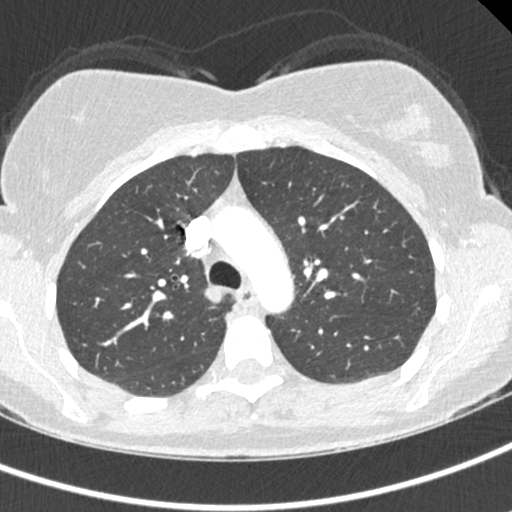
[im 228/297  mediastinal]
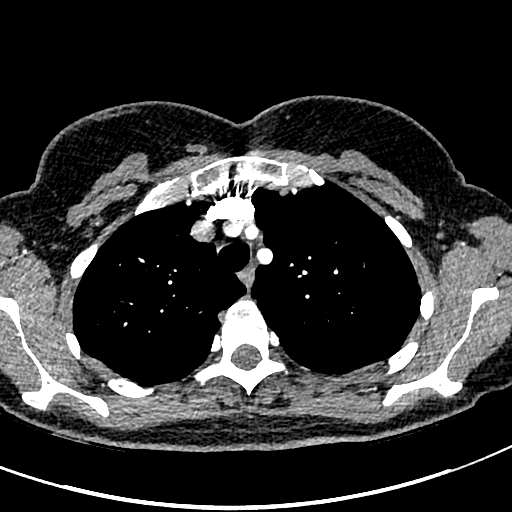
[im 251/297  lung]
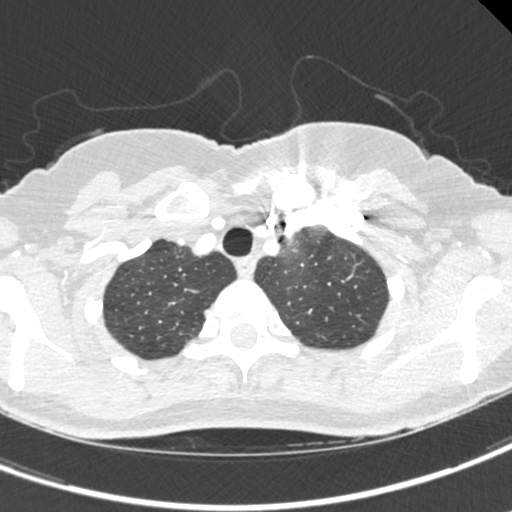
[im 274/297  mediastinal]
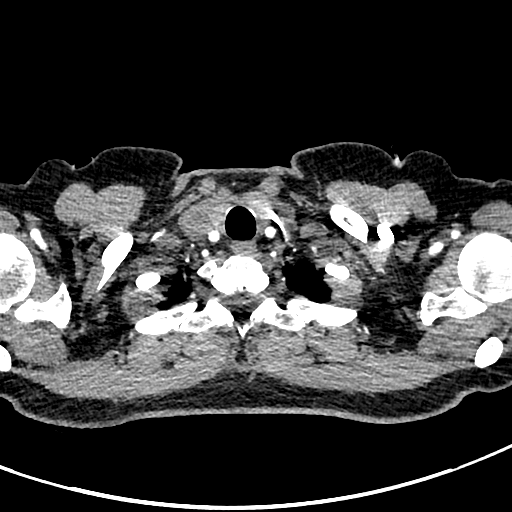

[Series 8: cta pulmonary 2.00 cor · coronal · 0.56mm/px · 1 of 120 slices shown]
[im 60/120  mediastinal]
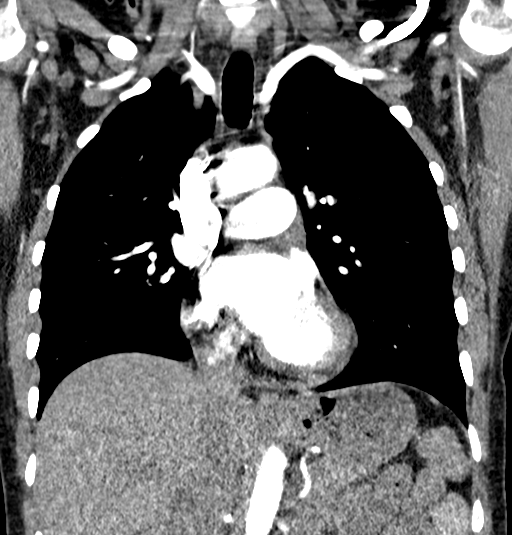

[13 of 36 positions shown; findings below may reference images not displayed]

FINDINGS: Cardiovascular: There is no demonstrable pulmonary embolus. There is
no thoracic aortic aneurysm or dissection. No pericardial effusion
or pericardial thickening evident.

Mediastinum/Nodes: Visualized thyroid appears unremarkable. There is
no evident thoracic adenopathy. No esophageal lesions evident.

Lungs/Pleura: There are scattered areas of minimal atelectatic
change. There is no edema or airspace opacity. No pleural effusions
are evident.

Upper Abdomen: Gallbladder absent. Visualized upper abdominal
structures otherwise appear normal.

Musculoskeletal: No blastic or lytic bone lesions. No chest wall
lesions.

Review of the MIP images confirms the above findings.
IMPRESSION: 1. No demonstrable pulmonary embolus. No thoracic aortic aneurysm or
dissection.

2.  Areas of minimal atelectasis.  No edema or airspace opacity.

3.  No adenopathy.

4.  Absent gallbladder.

## 2019-12-16 MED ORDER — IOHEXOL 350 MG/ML SOLN
75.0000 mL | Freq: Once | INTRAVENOUS | Status: AC | PRN
  Administered 2019-12-16: 11:00:00 75 mL via INTRAVENOUS

## 2020-08-29 DIAGNOSIS — R600 Localized edema: Secondary | ICD-10-CM | POA: Insufficient documentation

## 2020-09-05 ENCOUNTER — Other Ambulatory Visit: Payer: Self-pay | Admitting: Family Medicine

## 2020-09-05 ENCOUNTER — Ambulatory Visit
Admission: RE | Admit: 2020-09-05 | Discharge: 2020-09-05 | Disposition: A | Discharge status: Home / Self Care | Payer: BC Managed Care – PPO | Source: Ambulatory Visit | Attending: Family Medicine | Admitting: Family Medicine

## 2020-09-05 ENCOUNTER — Other Ambulatory Visit: Payer: Self-pay

## 2020-09-05 DIAGNOSIS — R109 Unspecified abdominal pain: Secondary | ICD-10-CM | POA: Diagnosis present

## 2020-09-05 IMAGING — CT CT ABD-PELV W/ CM
2 of 4 series · 14 of 42 positions shown, 18 images · IV contrast (APPLIED)
Comparison: 10/05/2018

CLINICAL DATA: Progressive left flank pain.

EXAM:
CT ABDOMEN AND PELVIS WITH CONTRAST
TECHNIQUE: Multidetector CT imaging of the abdomen and pelvis was performed
using the standard protocol following bolus administration of
intravenous contrast.
CONTRAST:  100mL OMNIPAQUE IOHEXOL 300 MG/ML  SOLN

[Series 2: routine abd/pel with · axial · 0.71mm/px · z∈[-887,-502]mm · 11 of 92 slices shown, 15 images]
[im 10/92  soft-tissue]
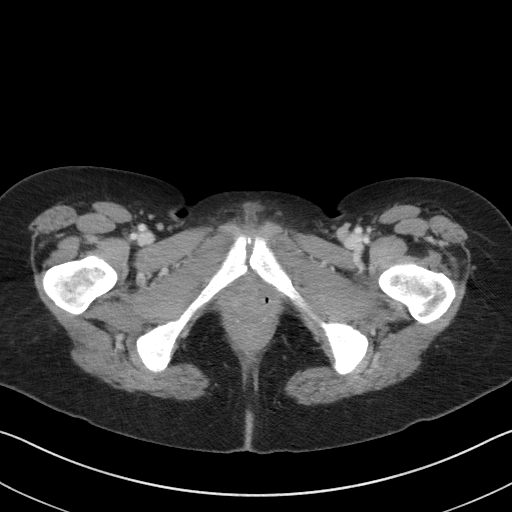
[im 10/92  bone]
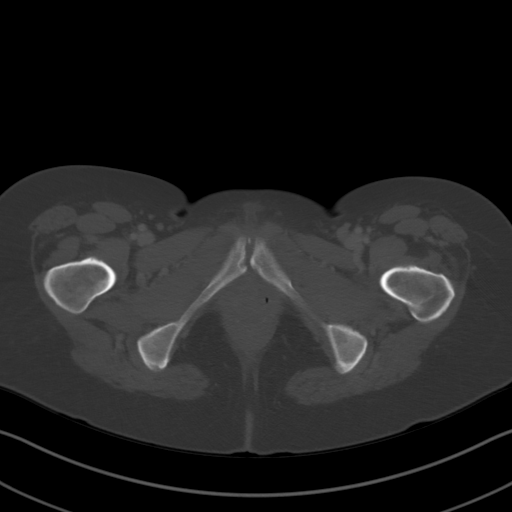
[im 20/92  soft-tissue]
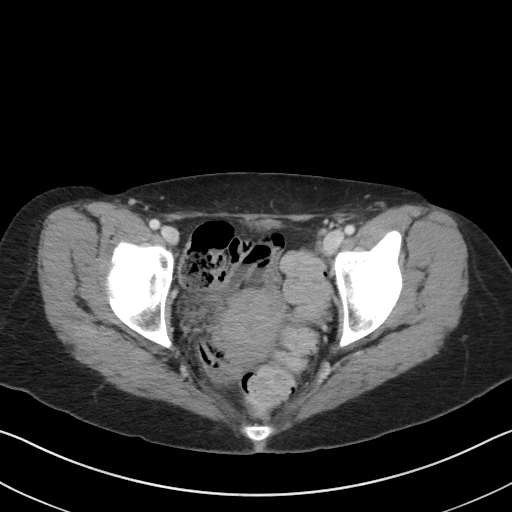
[im 29/92  soft-tissue]
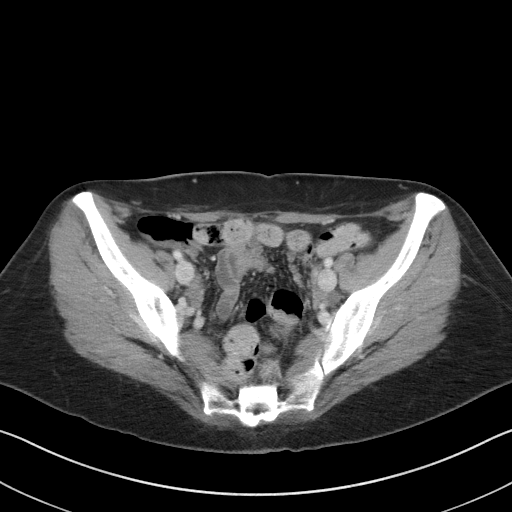
[im 39/92  soft-tissue]
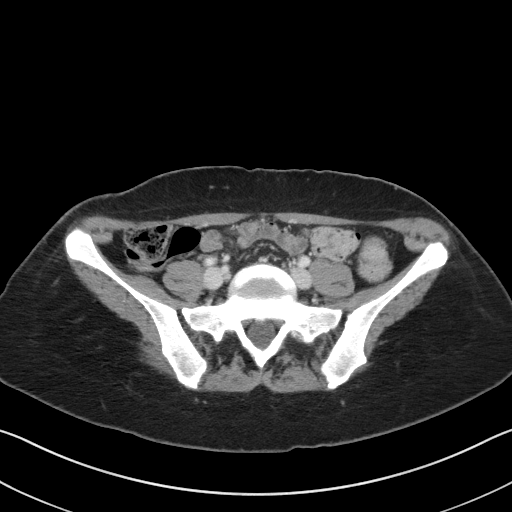
[im 48/92  soft-tissue]
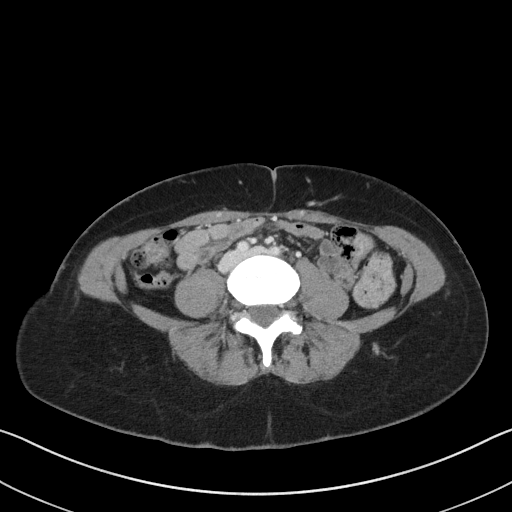
[im 58/92  soft-tissue]
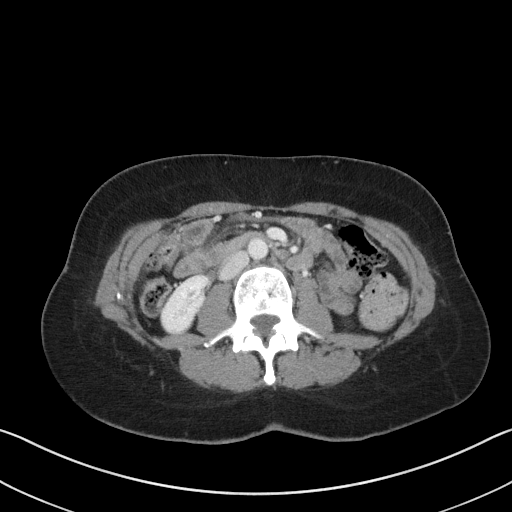
[im 68/92  soft-tissue]
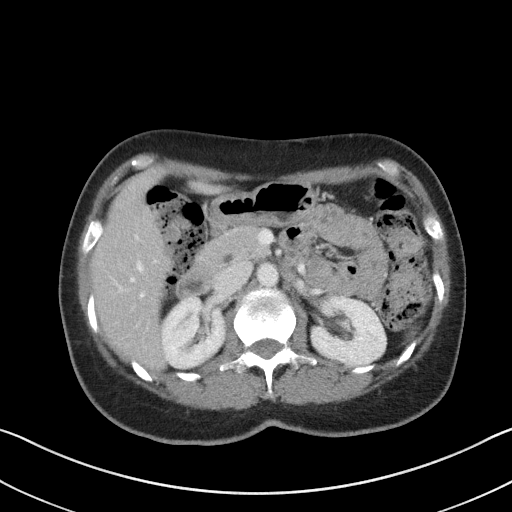
[im 72/92  lung]
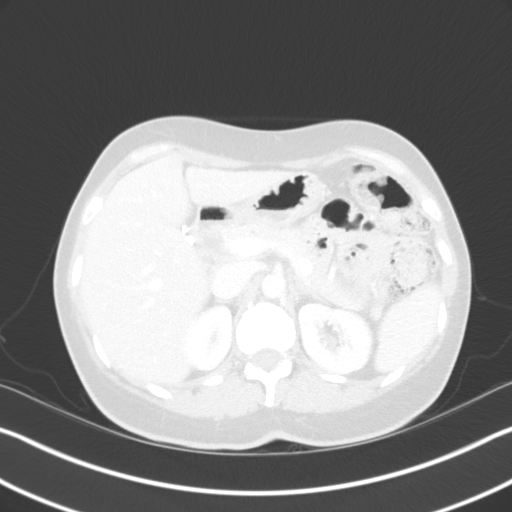
[im 77/92  soft-tissue]
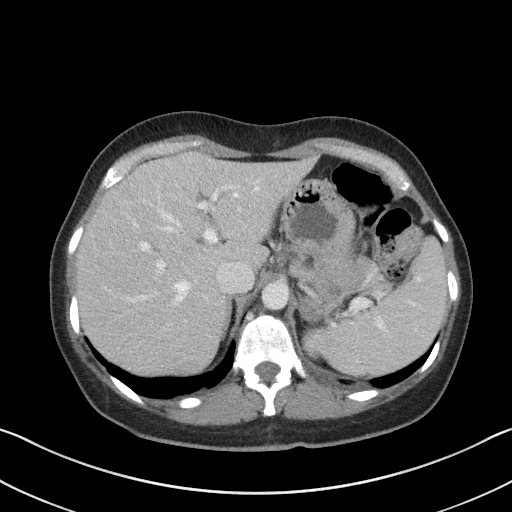
[im 77/92  lung]
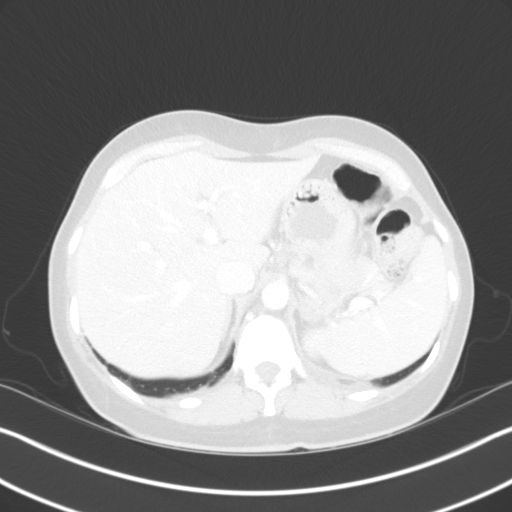
[im 82/92  lung]
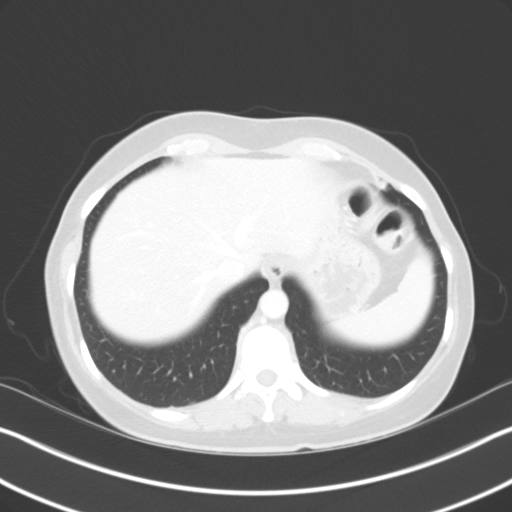
[im 87/92  soft-tissue]
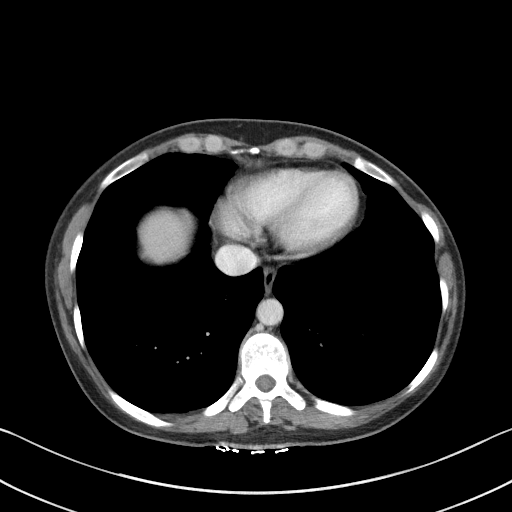
[im 87/92  lung]
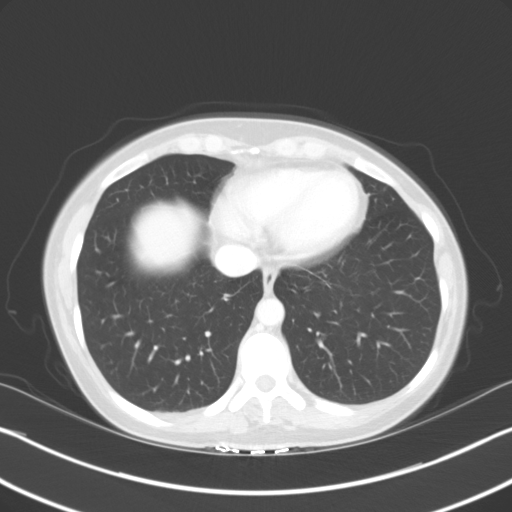
[im 87/92  bone]
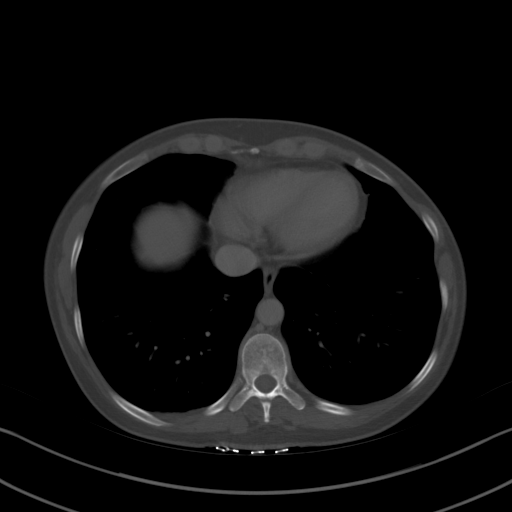

[Series 6: coronal st · coronal · 0.72mm/px · 3 of 82 slices shown]
[im 28/82  soft-tissue]
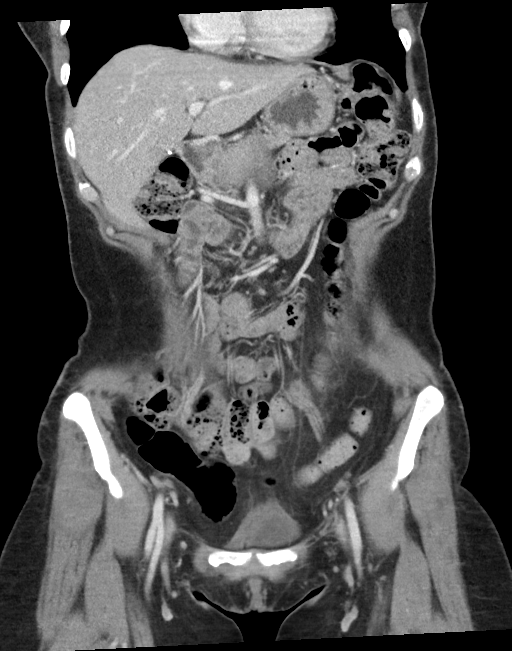
[im 37/82  soft-tissue]
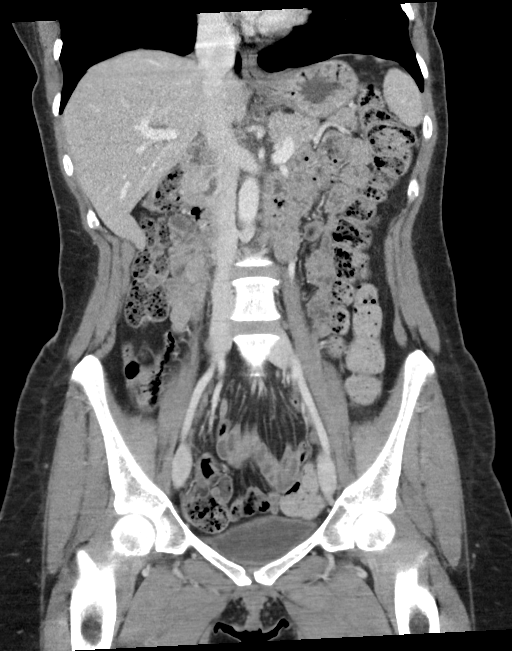
[im 46/82  soft-tissue]
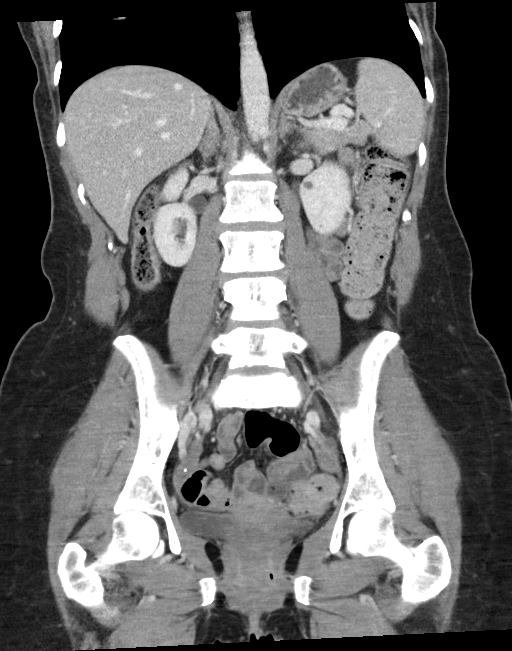

[14 of 42 positions shown; findings below may reference images not displayed]

FINDINGS: Lower chest: No acute abnormality.

Hepatobiliary: 6 mm low-density structure in lateral right hepatic
lobe is unchanged, image [DATE]. Likely small benign cysts. No
suspicious liver abnormality noted at this time. Previous
cholecystectomy. Mild central biliary prominence. The CBD measures
up to 7 mm, image 32/6.

Pancreas: Unremarkable. No pancreatic ductal dilatation or
surrounding inflammatory changes.

Spleen: Normal in size without focal abnormality.

Adrenals/Urinary Tract: Normal appearance of the adrenal glands.
Small low attenuation structure within the medial cortex of the left
mid kidney measures 5 mm is unchanged from previous exam. No kidney
stones or hydronephrosis identified bilaterally. Urinary bladder is
unremarkable.

Stomach/Bowel: Stomach appears normal. The appendix is not
confidently identified. No secondary signs of acute appendicitis. No
bowel wall thickening, inflammation or distension identified.

Vascular/Lymphatic: Normal appearance of the abdominal aorta. No
aneurysm. No abdominopelvic adenopathy identified.

Reproductive: Signs of previous hysterectomy. No adnexal mass
identified.

Other: No free fluid or fluid collections. No abdominal wall hernia.

Musculoskeletal: No acute or significant osseous findings.
IMPRESSION: No acute findings within the abdomen or pelvis. No explanation for
patient's left flank pain.

## 2020-09-05 MED ORDER — IOHEXOL 300 MG/ML  SOLN
100.0000 mL | Freq: Once | INTRAMUSCULAR | Status: AC | PRN
  Administered 2020-09-05: 100 mL via INTRAVENOUS

## 2020-10-10 ENCOUNTER — Ambulatory Visit: Payer: Self-pay | Admitting: Urology

## 2020-10-31 DIAGNOSIS — M6289 Other specified disorders of muscle: Secondary | ICD-10-CM | POA: Insufficient documentation

## 2022-03-07 DIAGNOSIS — R7303 Prediabetes: Secondary | ICD-10-CM | POA: Insufficient documentation

## 2022-03-12 ENCOUNTER — Other Ambulatory Visit (INDEPENDENT_AMBULATORY_CARE_PROVIDER_SITE_OTHER): Payer: Self-pay | Admitting: Nurse Practitioner

## 2022-03-12 DIAGNOSIS — I83819 Varicose veins of unspecified lower extremities with pain: Secondary | ICD-10-CM

## 2022-03-13 ENCOUNTER — Ambulatory Visit (INDEPENDENT_AMBULATORY_CARE_PROVIDER_SITE_OTHER): Payer: BC Managed Care – PPO

## 2022-03-13 ENCOUNTER — Encounter (INDEPENDENT_AMBULATORY_CARE_PROVIDER_SITE_OTHER): Payer: Self-pay | Admitting: Vascular Surgery

## 2022-03-13 ENCOUNTER — Ambulatory Visit (INDEPENDENT_AMBULATORY_CARE_PROVIDER_SITE_OTHER): Payer: Self-pay | Admitting: Vascular Surgery

## 2022-03-13 VITALS — BP 117/73 | HR 71 | Resp 16 | Wt 145.2 lb

## 2022-03-13 DIAGNOSIS — I341 Nonrheumatic mitral (valve) prolapse: Secondary | ICD-10-CM | POA: Insufficient documentation

## 2022-03-13 DIAGNOSIS — J309 Allergic rhinitis, unspecified: Secondary | ICD-10-CM | POA: Insufficient documentation

## 2022-03-13 DIAGNOSIS — T63421D Toxic effect of venom of ants, accidental (unintentional), subsequent encounter: Secondary | ICD-10-CM | POA: Insufficient documentation

## 2022-03-13 DIAGNOSIS — I83819 Varicose veins of unspecified lower extremities with pain: Secondary | ICD-10-CM | POA: Insufficient documentation

## 2022-03-13 DIAGNOSIS — Z91013 Allergy to seafood: Secondary | ICD-10-CM | POA: Insufficient documentation

## 2022-03-13 DIAGNOSIS — J301 Allergic rhinitis due to pollen: Secondary | ICD-10-CM | POA: Insufficient documentation

## 2022-03-13 DIAGNOSIS — I83813 Varicose veins of bilateral lower extremities with pain: Secondary | ICD-10-CM

## 2022-03-13 DIAGNOSIS — Z91038 Other insect allergy status: Secondary | ICD-10-CM | POA: Insufficient documentation

## 2022-03-13 DIAGNOSIS — L509 Urticaria, unspecified: Secondary | ICD-10-CM | POA: Insufficient documentation

## 2022-03-13 DIAGNOSIS — I493 Ventricular premature depolarization: Secondary | ICD-10-CM | POA: Insufficient documentation

## 2022-03-13 DIAGNOSIS — K219 Gastro-esophageal reflux disease without esophagitis: Secondary | ICD-10-CM

## 2022-03-13 NOTE — Progress Notes (Signed)
MRN : [id number]  Joy Pineda is a 51 y.o. (1971-04-23) female who presents with chief complaint of varicose veins hurt.  History of Present Illness:   The patient returns to the office for evaluation of worsening varicose veins.  She is status post laser ablation of the bilateral great saphenous veins remotely.  The patient notes that initially there was significant improvement in the lower extremity pain but over the past several years her pain of the symptoms. The patient notes multiple residual varicosities bilaterally which continued to hurt with dependent positions and remained tender to palpation. The patient's swelling is minimally from preoperative status. The patient continues to wear graduated compression stockings on a daily basis but these are not eliminating the pain and discomfort. The patient continues to use over-the-counter anti-inflammatory medications to treat the pain and related symptoms but this has not given the patient relief. The patient notes the pain in the lower extremities is causing problems with daily exercise, problems at work and even with household activities such as preparing meals and doing dishes.  The patient is otherwise done well and there have been no complications related to the laser procedure or interval changes in the patient's overall   Post laser ultrasound shows successful ablation of the great saphenous veins bilaterally with numerous large varicosities with superficial reflux    Current Meds  Medication Sig   cyanocobalamin (VITAMIN B12) 1000 MCG/ML injection Inject into the muscle.   linaclotide (LINZESS) 145 MCG CAPS capsule Take 145 mcg by mouth daily before breakfast.   LINZESS 145 MCG CAPS capsule Take 145 mcg by mouth daily.   metoprolol succinate (TOPROL-XL) 25 MG 24 hr tablet Take 1 tablet by mouth daily.   pantoprazole (PROTONIX) 20 MG tablet Take 20 mg by mouth daily.    Past Medical History:  Diagnosis Date   Diffuse  cystic mastopathy 2011   RIGHT   Heart murmur    Irritable bowel syndrome    Mitral valve prolapse 1995    Past Surgical History:  Procedure Laterality Date   ABDOMINAL HYSTERECTOMY  2006   CESAREAN SECTION  [phone]   CHOLECYSTECTOMY  1998   TONSILLECTOMY     TUBAL LIGATION     VEIN SURGERY  2006    Social History Social History   Tobacco Use   Smoking status: Never   Smokeless tobacco: Never  Substance Use Topics   Alcohol use: No   Drug use: No    Family History Family History  Problem Relation Age of Onset   Heart disease Mother    Diabetes Mother     Allergies  Allergen Reactions   Shellfish Allergy Hives   Fire Ant Swelling   Tape     BLISTERS   Cephalexin Hives and Rash    Other reaction(s): Unknown   Ciprofloxacin Rash     REVIEW OF SYSTEMS (Negative unless checked)  Constitutional: [] Weight loss  [] Fever  [] Chills Cardiac: [] Chest pain   [] Chest pressure   [] Palpitations   [] Shortness of breath when laying flat   [] Shortness of breath with exertion. Vascular:  [] Pain in legs with walking   [x] Pain in legs with standing  [] History of DVT   [] Phlebitis   [x] Swelling in legs   [x] Varicose veins   [] Non-healing ulcers Pulmonary:   [] Uses home oxygen   [] Productive cough   [] Hemoptysis   [] Wheeze  [] COPD   [] Asthma Neurologic:  [] Dizziness   [] Seizures   [] History of stroke   []   History of TIA  [] Aphasia   [] Vissual changes   [] Weakness or numbness in arm   [] Weakness or numbness in leg Musculoskeletal:   [] Joint swelling   [] Joint pain   [] Low back pain Hematologic:  [] Easy bruising  [] Easy bleeding   [] Hypercoagulable state   [] Anemic Gastrointestinal:  [] Diarrhea   [] Vomiting  [x] Gastroesophageal reflux/heartburn   [] Difficulty swallowing. Genitourinary:  [] Chronic kidney disease   [] Difficult urination  [] Frequent urination   [] Blood in urine Skin:  [] Rashes   [] Ulcers  Psychological:  [] History of anxiety   []  History of major  depression.  Physical Examination  Vitals:   03/13/22 0843  BP: 117/73  Pulse: 71  Resp: 16  Weight: 145 lb 3.2 oz (65.9 kg)   Body mass index is 22.74 kg/m. Gen: WD/WN, NAD Head: [place]/AT, No temporalis wasting.  Ear/Nose/Throat: Hearing grossly intact, nares w/o erythema or drainage, pinna without lesions Eyes: PER, EOMI, sclera nonicteric.  Neck: Supple, no gross masses.  No JVD.  Pulmonary:  Good air movement, no audible wheezing, no use of accessory muscles.  Cardiac: RRR, precordium not hyperdynamic. Vascular:  Large varicosities present, greater than 10 mm bilaterally.  Veins are tender to palpation  Moderate venous stasis changes to the legs bilaterally.  Trace soft pitting edema  Vessel Right Left  Radial Palpable Palpable  Gastrointestinal: soft, non-distended. No guarding/no peritoneal signs.  Musculoskeletal: M/S 5/5 throughout.  No deformity.  Neurologic: CN 2-12 intact. Pain and light touch intact in extremities.  Symmetrical.  Speech is fluent. Motor exam as listed above. Psychiatric: Judgment intact, Mood & affect appropriate for pt's clinical situation. Dermatologic: Venous rashes no ulcers noted.  No changes consistent with cellulitis. Lymph : No lichenification or skin changes of chronic lymphedema.  CBC Lab Results  Component Value Date   WBC 8.4 09/12/2012   HGB 14.8 09/12/2012   HCT 43.1 09/12/2012   MCV 95 09/12/2012   PLT 315 09/12/2012    BMET    Component Value Date/Time   NA 139 09/12/2012 0021   K 3.7 09/12/2012 0021   CL 106 09/12/2012 0021   CO2 27 09/12/2012 0021   GLUCOSE 124 (H) 09/12/2012 0021   BUN 9 09/12/2012 0021   CREATININE 0.60 12/16/2019 1038   CREATININE 0.59 (L) 09/12/2012 0021   CALCIUM 9.2 09/12/2012 0021   GFRNONAA >60 09/12/2012 0021   GFRAA >60 09/12/2012 0021   CrCl cannot be calculated (Patient's most recent lab result is older than the maximum 21 days allowed.).  COAG No results found for: "INR",  "PROTIME"  Radiology No results found.   Assessment/Plan 1. Varicose veins of both lower extremities with pain Recommend:  The patient has had successful ablation of the previously incompetent saphenous venous system but still has persistent symptoms of pain and swelling that are having a negative impact on daily life and daily activities.  Patient should undergo injection sclerotherapy to treat the residual varicosities.  The risks, benefits and alternative therapies were reviewed in detail with the patient.  All questions were answered.  The patient agrees to proceed with sclerotherapy at their convenience.  The patient will continue wearing the graduated compression stockings and using the over-the-counter pain medications to treat her symptoms.   2. Gastroesophageal reflux disease, unspecified whether esophagitis present Continue PPI as already ordered, this medication has been reviewed and there are no changes at this time.  Avoidence of caffeine and alcohol  Moderate elevation of the head of the bed  Levora Dredge, MD  03/13/2022 3:07 PM

## 2022-04-23 ENCOUNTER — Telehealth (INDEPENDENT_AMBULATORY_CARE_PROVIDER_SITE_OTHER): Payer: Self-pay | Admitting: Nurse Practitioner

## 2022-04-23 NOTE — Telephone Encounter (Signed)
LVM for pt TCB and schedule SALINE sclero appts with Sheppard Plumber, NP  bilateral SALINE sclero. no auth req. see FB

## 2022-06-16 ENCOUNTER — Encounter (INDEPENDENT_AMBULATORY_CARE_PROVIDER_SITE_OTHER): Payer: Self-pay

## 2022-06-24 ENCOUNTER — Other Ambulatory Visit: Payer: Self-pay | Admitting: Internal Medicine

## 2022-06-24 DIAGNOSIS — M79605 Pain in left leg: Secondary | ICD-10-CM

## 2022-06-24 DIAGNOSIS — M7989 Other specified soft tissue disorders: Secondary | ICD-10-CM

## 2022-07-08 ENCOUNTER — Ambulatory Visit
Admission: RE | Admit: 2022-07-08 | Discharge: 2022-07-08 | Disposition: A | Discharge status: Home / Self Care | Payer: BC Managed Care – PPO | Source: Ambulatory Visit | Attending: Internal Medicine | Admitting: Internal Medicine

## 2022-07-08 DIAGNOSIS — M79605 Pain in left leg: Secondary | ICD-10-CM | POA: Insufficient documentation

## 2022-07-08 DIAGNOSIS — M7989 Other specified soft tissue disorders: Secondary | ICD-10-CM | POA: Insufficient documentation

## 2023-02-05 ENCOUNTER — Other Ambulatory Visit: Payer: Self-pay

## 2023-02-05 ENCOUNTER — Ambulatory Visit
Admission: RE | Admit: 2023-02-05 | Discharge: 2023-02-05 | Disposition: A | Discharge status: Home / Self Care | Payer: BC Managed Care – PPO | Source: Ambulatory Visit | Attending: Physician Assistant | Admitting: Physician Assistant

## 2023-02-05 DIAGNOSIS — M79605 Pain in left leg: Secondary | ICD-10-CM | POA: Diagnosis present

## 2023-02-16 ENCOUNTER — Ambulatory Visit (INDEPENDENT_AMBULATORY_CARE_PROVIDER_SITE_OTHER): Payer: BC Managed Care – PPO | Admitting: Vascular Surgery

## 2023-02-23 ENCOUNTER — Encounter (INDEPENDENT_AMBULATORY_CARE_PROVIDER_SITE_OTHER): Payer: Self-pay | Admitting: Nurse Practitioner

## 2023-02-23 ENCOUNTER — Ambulatory Visit (INDEPENDENT_AMBULATORY_CARE_PROVIDER_SITE_OTHER): Payer: BC Managed Care – PPO | Admitting: Nurse Practitioner

## 2023-02-23 VITALS — BP 114/75 | HR 72 | Resp 18 | Ht 66.0 in | Wt 135.2 lb

## 2023-02-23 DIAGNOSIS — I83813 Varicose veins of bilateral lower extremities with pain: Secondary | ICD-10-CM

## 2023-02-23 NOTE — Progress Notes (Incomplete)
Subjective:    Patient ID: Joy Pineda, female    DOB: 1971-02-07, 52 y.o.   MRN: [id number] Chief Complaint  Patient presents with  . Venous Insufficiency    HPI  Review of Systems     Objective:   Physical Exam  BP 114/75 (BP Location: Left Arm)   Pulse 72   Resp 18   Ht 5\' 6"  (1.676 m)   Wt 135 lb 3.2 oz (61.3 kg)   BMI 21.82 kg/m   Past Medical History:  Diagnosis Date  . Diffuse cystic mastopathy 2011   RIGHT  . Heart murmur   . Irritable bowel syndrome   . Mitral valve prolapse 1995    Social History   Socioeconomic History  . Marital status: Married    Spouse name: Not on file  . Number of children: Not on file  . Years of education: Not on file  . Highest education level: Not on file  Occupational History  . Not on file  Tobacco Use  . Smoking status: Never  . Smokeless tobacco: Never  Substance and Sexual Activity  . Alcohol use: No  . Drug use: No  . Sexual activity: Not on file  Other Topics Concern  . Not on file  Social History Narrative  . Not on file   Social Determinants of Health   Financial Resource Strain: Not on file  Food Insecurity: Not on file  Transportation Needs: Not on file  Physical Activity: Not on file  Stress: Not on file  Social Connections: Not on file  Intimate Partner Violence: Not on file    Past Surgical History:  Procedure Laterality Date  . ABDOMINAL HYSTERECTOMY  2006  . CESAREAN SECTION  [phone]  . CHOLECYSTECTOMY  1998  . TONSILLECTOMY    . TUBAL LIGATION    . VEIN SURGERY  2006    Family History  Problem Relation Age of Onset  . Heart disease Mother   . Diabetes Mother     Allergies  Allergen Reactions  . Shellfish Allergy Hives  . Fire Ant Swelling  . Tape     BLISTERS  . Cephalexin Hives and Rash    Other reaction(s): Unknown  . Ciprofloxacin Rash       Latest Ref Rng & Units 09/12/2012   12:21 AM 12/03/2011   12:24 PM  CBC  WBC 3.6 - 11.0 x10 3/mm 3 8.4  7.5    Hemoglobin 12.0 - 16.0 g/dL 95.6  21.3   Hematocrit 35.0 - 47.0 % 43.1  41.3   Platelets 150 - 440 x10 3/mm 3 315  268       CMP     Component Value Date/Time   NA 139 09/12/2012 0021   K 3.7 09/12/2012 0021   CL 106 09/12/2012 0021   CO2 27 09/12/2012 0021   GLUCOSE 124 (H) 09/12/2012 0021   BUN 9 09/12/2012 0021   CREATININE 0.60 12/16/2019 1038   CREATININE 0.59 (L) 09/12/2012 0021   CALCIUM 9.2 09/12/2012 0021   PROT 8.1 09/12/2012 0021   ALBUMIN 4.2 09/12/2012 0021   AST 18 09/12/2012 0021   ALT 20 09/12/2012 0021   ALKPHOS 82 09/12/2012 0021   BILITOT 0.9 09/12/2012 0021   GFRNONAA >60 09/12/2012 0021     No results found.     Assessment & Plan:   1. Varicose veins of both lower extremities with pain Based upon the patient's description of symptoms it certainly likely that her varicosities  causing a lot of her symptoms.  I discussed with patient she may have recannulization of the previously treated great saphenous vein.  Alternatively the patient may have an accessory saphenous vein causing her discomfort.  She also has some beginning changes in her left lower extremity.  We will have the patient return at her convenience for a bilateral venous reflux study to determine next possible steps   Current Outpatient Medications on File Prior to Visit  Medication Sig Dispense Refill  . Cyanocobalamin (VITAMIN B-12 IJ) Vitamin B12    . EPINEPHrine 0.3 mg/0.3 mL IJ SOAJ injection as directed Injection prn for systemic reactions    . linaclotide (LINZESS) 145 MCG CAPS capsule Take 145 mcg by mouth daily before breakfast.    . pantoprazole (PROTONIX) 20 MG tablet Take 20 mg by mouth daily.     No current facility-administered medications on file prior to visit.    There are no Patient Instructions on file for this visit. No follow-ups on file.   Georgiana Spinner, NP

## 2023-02-23 NOTE — Progress Notes (Signed)
Subjective:    Patient ID: Joy Pineda, female    DOB: [birth year], 52 y.o.   MRN: [id number] Chief Complaint  Patient presents with   Venous Insufficiency    Joy Pineda is a 52 year old female who presents today for evaluation of pain in her left lower extremity.  The patient notes that she previously had an endovenous laser ablation done years ago by a physician previously with our practice.  She notes that she was in her usual state of health until early June or so and she began to experience worsening swelling tenderness and heat and discomfort in her left lower extremity.  She notes that if she does things such as standing for too long or moving and pushing heavy things she gets very tender and aching in her right lower extremity.  She also notes that she has extreme sensitivity to touch after a long day especially near the area where there is a prominent vein on her inner thigh area.  She previously had a DVT study done which was negative for DVT or superficial phlebitis.  Is also noted that recently there has been some increase in tenderness on her left although it is not nearly as bad as her right.    Review of Systems  Cardiovascular:  Positive for leg swelling.  Musculoskeletal:  Positive for myalgias.  All other systems reviewed and are negative.      Objective:   Physical Exam Vitals reviewed.  HENT:     Head: Normocephalic.  Cardiovascular:     Rate and Rhythm: Normal rate.     Pulses:          Dorsalis pedis pulses are 2+ on the left side.       Posterior tibial pulses are 2+ on the left side.  Pulmonary:     Effort: Pulmonary effort is normal.  Musculoskeletal:        General: Tenderness present.  Skin:    General: Skin is warm and dry.  Neurological:     Mental Status: She is alert and oriented to person, place, and time.  Psychiatric:        Mood and Affect: Mood normal.        Behavior: Behavior normal.        Thought Content: Thought content  normal.        Judgment: Judgment normal.     BP 114/75 (BP Location: Left Arm)   Pulse 72   Resp 18   Ht 5\' 6"  (1.676 m)   Wt 135 lb 3.2 oz (61.3 kg)   BMI 21.82 kg/m   Past Medical History:  Diagnosis Date   Diffuse cystic mastopathy 2011   RIGHT   Heart murmur    Irritable bowel syndrome    Mitral valve prolapse 1995    Social History   Socioeconomic History   Marital status: Married    Spouse name: Not on file   Number of children: Not on file   Years of education: Not on file   Highest education level: Not on file  Occupational History   Not on file  Tobacco Use   Smoking status: Never   Smokeless tobacco: Never  Substance and Sexual Activity   Alcohol use: No   Drug use: No   Sexual activity: Not on file  Other Topics Concern   Not on file  Social History Narrative   Not on file   Social Determinants of Health   Financial Resource Strain: Not on  file  Food Insecurity: Not on file  Transportation Needs: Not on file  Physical Activity: Not on file  Stress: Not on file  Social Connections: Not on file  Intimate Partner Violence: Not on file    Past Surgical History:  Procedure Laterality Date   ABDOMINAL HYSTERECTOMY  2006   CESAREAN SECTION  [phone]   CHOLECYSTECTOMY  1998   TONSILLECTOMY     TUBAL LIGATION     VEIN SURGERY  2006    Family History  Problem Relation Age of Onset   Heart disease Mother    Diabetes Mother     Allergies  Allergen Reactions   Shellfish Allergy Hives   Fire Ant Swelling   Tape     BLISTERS   Cephalexin Hives and Rash    Other reaction(s): Unknown   Ciprofloxacin Rash       Latest Ref Rng & Units 09/12/2012   12:21 AM 12/03/2011   12:24 PM  CBC  WBC 3.6 - 11.0 x10 3/mm 3 8.4  7.5   Hemoglobin 12.0 - 16.0 g/dL 81.1  91.4   Hematocrit 35.0 - 47.0 % 43.1  41.3   Platelets 150 - 440 x10 3/mm 3 315  268       CMP     Component Value Date/Time   NA 139 09/12/2012 0021   K 3.7 09/12/2012  0021   CL 106 09/12/2012 0021   CO2 27 09/12/2012 0021   GLUCOSE 124 (H) 09/12/2012 0021   BUN 9 09/12/2012 0021   CREATININE 0.60 12/16/2019 1038   CREATININE 0.59 (L) 09/12/2012 0021   CALCIUM 9.2 09/12/2012 0021   PROT 8.1 09/12/2012 0021   ALBUMIN 4.2 09/12/2012 0021   AST 18 09/12/2012 0021   ALT 20 09/12/2012 0021   ALKPHOS 82 09/12/2012 0021   BILITOT 0.9 09/12/2012 0021   GFRNONAA >60 09/12/2012 0021     No results found.     Assessment & Plan:   1. Varicose veins of both lower extremities with pain Based upon the patient's description of symptoms it certainly likely that her varicosities causing a lot of her symptoms.  I discussed with patient she may have recannulization of the previously treated great saphenous vein.  Alternatively the patient may have an accessory saphenous vein causing her discomfort.  She also has some beginning changes in her left lower extremity.  We will have the patient return at her convenience for a bilateral venous reflux study to determine next possible steps   Current Outpatient Medications on File Prior to Visit  Medication Sig Dispense Refill   Cyanocobalamin (VITAMIN B-12 IJ) Vitamin B12     EPINEPHrine 0.3 mg/0.3 mL IJ SOAJ injection as directed Injection prn for systemic reactions     linaclotide (LINZESS) 145 MCG CAPS capsule Take 145 mcg by mouth daily before breakfast.     pantoprazole (PROTONIX) 20 MG tablet Take 20 mg by mouth daily.     No current facility-administered medications on file prior to visit.    There are no Patient Instructions on file for this visit. No follow-ups on file.   Georgiana Spinner, NP

## 2023-02-25 ENCOUNTER — Other Ambulatory Visit (INDEPENDENT_AMBULATORY_CARE_PROVIDER_SITE_OTHER): Payer: Self-pay | Admitting: Nurse Practitioner

## 2023-02-25 DIAGNOSIS — I83813 Varicose veins of bilateral lower extremities with pain: Secondary | ICD-10-CM

## 2023-03-10 ENCOUNTER — Ambulatory Visit (INDEPENDENT_AMBULATORY_CARE_PROVIDER_SITE_OTHER): Payer: BC Managed Care – PPO

## 2023-03-10 DIAGNOSIS — I83813 Varicose veins of bilateral lower extremities with pain: Secondary | ICD-10-CM | POA: Diagnosis not present

## 2023-03-16 ENCOUNTER — Encounter (INDEPENDENT_AMBULATORY_CARE_PROVIDER_SITE_OTHER): Payer: Self-pay | Admitting: Vascular Surgery

## 2023-03-16 ENCOUNTER — Ambulatory Visit (INDEPENDENT_AMBULATORY_CARE_PROVIDER_SITE_OTHER): Payer: BC Managed Care – PPO | Admitting: Vascular Surgery

## 2023-03-16 VITALS — BP 121/77 | HR 67 | Resp 16 | Ht 66.5 in | Wt 136.6 lb

## 2023-03-16 DIAGNOSIS — R600 Localized edema: Secondary | ICD-10-CM | POA: Diagnosis not present

## 2023-03-16 DIAGNOSIS — I83813 Varicose veins of bilateral lower extremities with pain: Secondary | ICD-10-CM | POA: Diagnosis not present

## 2023-03-16 DIAGNOSIS — K219 Gastro-esophageal reflux disease without esophagitis: Secondary | ICD-10-CM | POA: Diagnosis not present

## 2023-03-16 NOTE — Progress Notes (Unsigned)
MRN : [id number]  Joy Pineda is a 52 y.o. (11-10-1970) female who presents with chief complaint of varicose veins hurt.  History of Present Illness: ***  No outpatient medications have been marked as taking for the 03/16/23 encounter (Appointment) with Gilda Crease, Latina Craver, MD.    Past Medical History:  Diagnosis Date   Diffuse cystic mastopathy 2011   RIGHT   Heart murmur    Irritable bowel syndrome    Mitral valve prolapse 1995    Past Surgical History:  Procedure Laterality Date   ABDOMINAL HYSTERECTOMY  2006   CESAREAN SECTION  [phone]   CHOLECYSTECTOMY  1998   TONSILLECTOMY     TUBAL LIGATION     VEIN SURGERY  2006    Social History Social History   Tobacco Use   Smoking status: Never   Smokeless tobacco: Never  Substance Use Topics   Alcohol use: No   Drug use: No    Family History Family History  Problem Relation Age of Onset   Heart disease Mother    Diabetes Mother     Allergies  Allergen Reactions   Shellfish Allergy Hives   Fire Ant Swelling   Tape     BLISTERS   Cephalexin Hives and Rash    Other reaction(s): Unknown   Ciprofloxacin Rash     REVIEW OF SYSTEMS (Negative unless checked)  Constitutional: [] Weight loss  [] Fever  [] Chills Cardiac: [] Chest pain   [] Chest pressure   [] Palpitations   [] Shortness of breath when laying flat   [] Shortness of breath with exertion. Vascular:  [] Pain in legs with walking   [x] Pain in legs with standing  [] History of DVT   [] Phlebitis   [] Swelling in legs   [x] Varicose veins   [] Non-healing ulcers Pulmonary:   [] Uses home oxygen   [] Productive cough   [] Hemoptysis   [] Wheeze  [] COPD   [] Asthma Neurologic:  [] Dizziness   [] Seizures   [] History of stroke   [] History of TIA  [] Aphasia   [] Vissual changes   [] Weakness or numbness in arm   [] Weakness or numbness in leg Musculoskeletal:   [] Joint swelling   [] Joint pain   [] Low back pain Hematologic:  [] Easy bruising  [] Easy  bleeding   [] Hypercoagulable state   [] Anemic Gastrointestinal:  [] Diarrhea   [] Vomiting  [] Gastroesophageal reflux/heartburn   [] Difficulty swallowing. Genitourinary:  [] Chronic kidney disease   [] Difficult urination  [] Frequent urination   [] Blood in urine Skin:  [] Rashes   [] Ulcers  Psychological:  [] History of anxiety   []  History of major depression.  Physical Examination  There were no vitals filed for this visit. There is no height or weight on file to calculate BMI. Gen: WD/WN, NAD Head: [place]/AT, No temporalis wasting.  Ear/Nose/Throat: Hearing grossly intact, nares w/o erythema or drainage, pinna without lesions Eyes: PER, EOMI, sclera nonicteric.  Neck: Supple, no gross masses.  No JVD.  Pulmonary:  Good air movement, no audible wheezing, no use of accessory muscles.  Cardiac: RRR, precordium not hyperdynamic. Vascular:  Large varicosities present, greater than 10 mm ***.  Veins are tender to palpation  Mild venous stasis changes to the legs bilaterally.  Trace soft pitting edema CEAP C3sEpAsPr Vessel Right Left  Radial Palpable Palpable  Gastrointestinal: soft, non-distended. No guarding/no peritoneal signs.  Musculoskeletal: M/S 5/5 throughout.  No deformity.  Neurologic: CN 2-12 intact. Pain and light touch intact in extremities.  Symmetrical.  Speech is fluent. Motor exam as listed above. Psychiatric: Judgment intact, Mood & affect appropriate for pt's clinical situation. Dermatologic: Venous rashes no ulcers noted.  No changes consistent with cellulitis. Lymph : No lichenification or skin changes of chronic lymphedema.  CBC Lab Results  Component Value Date   WBC 8.4 09/12/2012   HGB 14.8 09/12/2012   HCT 43.1 09/12/2012   MCV 95 09/12/2012   PLT 315 09/12/2012    BMET    Component Value Date/Time   NA 139 09/12/2012 0021   K 3.7 09/12/2012 0021   CL 106 09/12/2012 0021   CO2 27 09/12/2012 0021   GLUCOSE 124 (H) 09/12/2012 0021   BUN 9 09/12/2012 0021    CREATININE 0.60 12/16/2019 1038   CREATININE 0.59 (L) 09/12/2012 0021   CALCIUM 9.2 09/12/2012 0021   GFRNONAA >60 09/12/2012 0021   GFRAA >60 09/12/2012 0021   CrCl cannot be calculated (Patient's most recent lab result is older than the maximum 21 days allowed.).  COAG No results found for: "INR", "PROTIME"  Radiology VAS [place] LOWER EXTREMITY VENOUS REFLUX  Result Date: 03/11/2023  Lower Venous Reflux Study Patient Name:  Joy Pineda  Date of Exam:   03/10/2023 Medical Rec #: [id number]         Accession #:    [id number] Date of Birth: 03-19-1971         Patient Gender: F Patient Age:   39 years Exam Location:  [facility] Procedure:      VAS [place] LOWER EXTREMITY VENOUS REFLUX Referring Phys: Sheppard Plumber --------------------------------------------------------------------------------  Indications: Pain, Swelling, Edema, and varicosities. Other Indications: Pain and bulging varicosities in left leg when standing for                    long periods. Risk Factors: Surgery Prior left GSV ablation 2006. Performing Technologist: Hardie Lora RVT  Examination Guidelines: A complete evaluation includes B-mode imaging, spectral Doppler, color Doppler, and power Doppler as needed of all accessible portions of each vessel. Bilateral testing is considered an integral part of a complete examination. Limited examinations for reoccurring indications may be performed as noted. The reflux portion of the exam is performed with the patient in reverse Trendelenburg. Significant venous reflux is defined as >500 ms in the superficial venous system, and >1 second in the deep venous system.  Venous Reflux Times +--------------+---------+------+-----------+------------+--------+ RIGHT         Reflux NoRefluxReflux TimeDiameter cmsComments                         Yes                                  +--------------+---------+------+-----------+------------+--------+ CFV                     yes    >1 second                      +--------------+---------+------+-----------+------------+--------+ FV prox       no                                             +--------------+---------+------+-----------+------------+--------+ FV mid        no                                             +--------------+---------+------+-----------+------------+--------+  FV dist       no                                             +--------------+---------+------+-----------+------------+--------+ Popliteal     no                                             +--------------+---------+------+-----------+------------+--------+ GSV at [facility]    no                            0.71             +--------------+---------+------+-----------+------------+--------+ GSV prox thighno                            0.35             +--------------+---------+------+-----------+------------+--------+ GSV mid thigh no                            0.35             +--------------+---------+------+-----------+------------+--------+ GSV dist thighno                            0.25             +--------------+---------+------+-----------+------------+--------+ GSV at knee   no                            0.24             +--------------+---------+------+-----------+------------+--------+ GSV prox calf no                            0.24             +--------------+---------+------+-----------+------------+--------+ SSV Pop Fossa no                            0.36             +--------------+---------+------+-----------+------------+--------+ SSV prox calf no                            0.17             +--------------+---------+------+-----------+------------+--------+ SSV mid calf            yes    >500 ms      0.31             +--------------+---------+------+-----------+------------+--------+                                             0.36              +--------------+---------+------+-----------+------------+--------+  +---------------+---------+------+---------+-----------+----------------------+ LEFT           Reflux NoReflux Reflux   Diameter  Comments  Yes    Time       cms                           +---------------+---------+------+---------+-----------+----------------------+ CFV                      yes  >1 second                                  +---------------+---------+------+---------+-----------+----------------------+ FV prox        no                                                        +---------------+---------+------+---------+-----------+----------------------+ FV mid         no                                                        +---------------+---------+------+---------+-----------+----------------------+ FV dist        no                                                        +---------------+---------+------+---------+-----------+----------------------+ Popliteal      no                                                        +---------------+---------+------+---------+-----------+----------------------+ GSV at SFJ               yes   >500 ms    0.69    AAGSV                  +---------------+---------+------+---------+-----------+----------------------+ GSV prox thigh                                    prior                                                                    ablation/stripping     +---------------+---------+------+---------+-----------+----------------------+ GSV mid thigh                                     prior  ablation/stripping     +---------------+---------+------+---------+-----------+----------------------+ GSV dist thigh                            0.27                            +---------------+---------+------+---------+-----------+----------------------+ GSV at knee                               0.26                           +---------------+---------+------+---------+-----------+----------------------+ GSV prox calf                             0.39                           +---------------+---------+------+---------+-----------+----------------------+ SSV Pop Fossa                             0.45                           +---------------+---------+------+---------+-----------+----------------------+ SSV prox calf                             0.23                           +---------------+---------+------+---------+-----------+----------------------+ SSV mid calf                              0.24                           +---------------+---------+------+---------+-----------+----------------------+ AAGSV SFJ                yes   >500 ms    0.69                           +---------------+---------+------+---------+-----------+----------------------+ AAGSV prox               yes   >500 ms    0.33                           thigh                                                                    +---------------+---------+------+---------+-----------+----------------------+ AAGSV mid thighno                         0.36                           +---------------+---------+------+---------+-----------+----------------------+   Summary: Right: - No evidence of deep  vein thrombosis seen in the right lower extremity, from the common femoral through the popliteal veins. - No evidence of superficial venous thrombosis in the right lower extremity. - No evidence of superficial venous reflux seen in the right greater saphenous vein. - Venous reflux is noted in the right short saphenous vein.  Left: - No evidence of deep vein thrombosis seen in the left lower extremity, from the common femoral through the popliteal veins. - No  evidence of superficial venous thrombosis in the left lower extremity. - No evidence of superficial venous reflux seen in the left short saphenous vein. - Venous reflux is noted in the left sapheno-femoral junction. - AAGSV demonstrates reflux and give rise to symptomatic veins.  *See table(s) above for measurements and observations. Electronically signed by Festus Barren MD on 03/11/2023 at 1:39:32 PM.    Final      Assessment/Plan There are no diagnoses linked to this encounter.   Levora Dredge, MD  03/16/2023 1:00 PM

## 2023-03-18 ENCOUNTER — Encounter (INDEPENDENT_AMBULATORY_CARE_PROVIDER_SITE_OTHER): Payer: Self-pay | Admitting: Vascular Surgery

## 2023-05-20 ENCOUNTER — Telehealth (INDEPENDENT_AMBULATORY_CARE_PROVIDER_SITE_OTHER): Payer: Self-pay

## 2023-05-20 NOTE — Telephone Encounter (Signed)
LVM for pt to call back to our office to schedule appt for her laser

## 2023-05-21 ENCOUNTER — Telehealth (INDEPENDENT_AMBULATORY_CARE_PROVIDER_SITE_OTHER): Payer: Self-pay

## 2023-05-21 NOTE — Telephone Encounter (Signed)
LVM for pt to call back to office to get laser appt scheduled

## 2023-05-27 ENCOUNTER — Telehealth (INDEPENDENT_AMBULATORY_CARE_PROVIDER_SITE_OTHER): Payer: Self-pay

## 2023-05-27 NOTE — Telephone Encounter (Signed)
LVM for pt TCB to schedule laser

## 2024-01-05 ENCOUNTER — Encounter (INDEPENDENT_AMBULATORY_CARE_PROVIDER_SITE_OTHER): Payer: Self-pay

## 2024-03-08 ENCOUNTER — Other Ambulatory Visit: Payer: Self-pay | Admitting: Internal Medicine

## 2024-03-08 DIAGNOSIS — R1032 Left lower quadrant pain: Secondary | ICD-10-CM

## 2024-03-11 ENCOUNTER — Ambulatory Visit: Admission: RE | Admit: 2024-03-11 | Source: Ambulatory Visit

## 2024-03-16 ENCOUNTER — Other Ambulatory Visit: Payer: Self-pay | Admitting: Internal Medicine

## 2024-03-16 DIAGNOSIS — R1032 Left lower quadrant pain: Secondary | ICD-10-CM

## 2024-03-20 ENCOUNTER — Emergency Department
Admission: EM | Admit: 2024-03-20 | Discharge: 2024-03-20 | Disposition: A | Discharge status: Home / Self Care | Attending: Emergency Medicine | Admitting: Emergency Medicine

## 2024-03-20 ENCOUNTER — Other Ambulatory Visit: Payer: Self-pay

## 2024-03-20 ENCOUNTER — Emergency Department

## 2024-03-20 DIAGNOSIS — E876 Hypokalemia: Secondary | ICD-10-CM | POA: Insufficient documentation

## 2024-03-20 DIAGNOSIS — W57XXXA Bitten or stung by nonvenomous insect and other nonvenomous arthropods, initial encounter: Secondary | ICD-10-CM | POA: Diagnosis not present

## 2024-03-20 DIAGNOSIS — M7918 Myalgia, other site: Secondary | ICD-10-CM | POA: Diagnosis present

## 2024-03-20 DIAGNOSIS — S0006XA Insect bite (nonvenomous) of scalp, initial encounter: Secondary | ICD-10-CM | POA: Diagnosis not present

## 2024-03-20 LAB — URINALYSIS, ROUTINE W REFLEX MICROSCOPIC
Bacteria, UA: NONE SEEN
Bilirubin Urine: NEGATIVE
Glucose, UA: NEGATIVE mg/dL
Hgb urine dipstick: NEGATIVE
Ketones, ur: NEGATIVE mg/dL
Nitrite: NEGATIVE
Protein, ur: NEGATIVE mg/dL
Specific Gravity, Urine: 1.005 (ref 1.005–1.030)
pH: 6 (ref 5.0–8.0)

## 2024-03-20 LAB — CBC WITH DIFFERENTIAL/PLATELET
Abs Immature Granulocytes: 0.04 K/uL (ref 0.00–0.07)
Basophils Absolute: 0.1 K/uL (ref 0.0–0.1)
Basophils Relative: 2 %
Eosinophils Absolute: 0.1 K/uL (ref 0.0–0.5)
Eosinophils Relative: 2 %
HCT: 41.7 % (ref 36.0–46.0)
Hemoglobin: 13.7 g/dL (ref 12.0–15.0)
Immature Granulocytes: 1 %
Lymphocytes Relative: 39 %
Lymphs Abs: 1.8 K/uL (ref 0.7–4.0)
MCH: 31.6 pg (ref 26.0–34.0)
MCHC: 32.9 g/dL (ref 30.0–36.0)
MCV: 96.3 fL (ref 80.0–100.0)
Monocytes Absolute: 0.4 K/uL (ref 0.1–1.0)
Monocytes Relative: 10 %
Neutro Abs: 2.1 K/uL (ref 1.7–7.7)
Neutrophils Relative %: 46 %
Platelets: 297 K/uL (ref 150–400)
RBC: 4.33 MIL/uL (ref 3.87–5.11)
RDW: 12.6 % (ref 11.5–15.5)
WBC: 4.5 K/uL (ref 4.0–10.5)
nRBC: 0 % (ref 0.0–0.2)

## 2024-03-20 LAB — BASIC METABOLIC PANEL WITH GFR
Anion gap: 11 (ref 5–15)
BUN: 14 mg/dL (ref 6–20)
CO2: 28 mmol/L (ref 22–32)
Calcium: 9.2 mg/dL (ref 8.9–10.3)
Chloride: 101 mmol/L (ref 98–111)
Creatinine, Ser: 0.56 mg/dL (ref 0.44–1.00)
GFR, Estimated: >60 mL/min (ref >60)
Glucose, Bld: 99 mg/dL (ref 70–99)
Potassium: 3.1 mmol/L — ABNORMAL LOW (ref 3.5–5.1)
Sodium: 140 mmol/L (ref 135–145)

## 2024-03-20 MED ORDER — DOXYCYCLINE HYCLATE 100 MG PO CAPS: 100.0000 mg | ORAL_CAPSULE | Freq: Two times a day (BID) | ORAL | 0 refills | Status: DC

## 2024-03-20 MED ORDER — ONDANSETRON HCL 4 MG/2ML IJ SOLN
4.0000 mg | Freq: Once | INTRAMUSCULAR | Status: AC
  Administered 2024-03-20: 4 mg via INTRAVENOUS
  Filled 2024-03-20: qty 2

## 2024-03-20 MED ORDER — ETODOLAC 400 MG PO TABS: 400.0000 mg | ORAL_TABLET | Freq: Two times a day (BID) | ORAL | 0 refills | Status: DC

## 2024-03-20 NOTE — ED Notes (Signed)
 Provider at bedside

## 2024-03-20 NOTE — Discharge Instructions (Signed)
 Follow-up with your primary care provider if any continued problems or not improving.  A prescription for doxycycline  and etodolac  was sent to the pharmacy for you to begin taking.  The etodolac  is an anti-inflammatory and should be taken twice a day with food.  Doxycycline  is to cover any tickborne illnesses that could be causing your multiple muscle and joint pains.  Urinalysis today was essentially negative.  Return to the emergency department if any severe worsening of your symptoms such as high fever, headaches, increased joint pain.

## 2024-03-20 NOTE — ED Notes (Signed)
 Pt has seen PCP and was told had GBS-related UTI. Was prescribed Macrobid, which she took. Then was told yesterday at [facility] does not treat UTI and was prescribed amoxicillin. Pt has been having bilateral lower and mid back pain. Is urinating less with voids. Had urine culture yesterday at [facility]. Pt cannot take cipro or cephalexin, is gets hives with cephalosporins. Pt is not having unilateral flank pain. Pt was also put on prednisone recently and is having discomfort to L ear and was told last week that L eardrum is bulging. Pt is also having neck pain.

## 2024-03-20 NOTE — ED Provider Notes (Signed)
 [facility] Provider Note    Event Date/Time   First MD Initiated Contact with Patient 03/20/24 [phone]     (approximate)   History   Flank Pain   HPI  Joy Pineda is a 53 y.o. female presents to the ED with complaint of bilateral back pain.  Patient states that in the last 2 weeks she was diagnosed with a urinary tract infection at that time placed on Macrobid.  Later she developed ear pain and was diagnosed with sinusitis.  She was placed on azithromycin and later followed by Dr. Herminio who placed her on prednisone.  She states that after taking the prednisone she felt that her symptoms of her urinary tract infection has returned.  She now complains of multiple muscle and joint aches.  No rash noted.  She denies any fever, chills or vomiting but does endorse some nausea this morning.  Patient does report that she had a tick bite on July 5 before her symptoms started.  Tick was attached to the left posterior scalp area.  She denies any known fever and denies rash with this.  Patient has history of mitral valve prolapse, mitral valve regurgitation, symptomatic PVCs, varicose veins lower extremities, GERD, pelvic floor dysfunction, B12 deficiency and prediabetes.     Physical Exam   Triage Vital Signs: ED Triage Vitals  Encounter Vitals Group     BP 03/20/24 0752 (!) 155/86     Girls Systolic BP Percentile --      Girls Diastolic BP Percentile --      Boys Systolic BP Percentile --      Boys Diastolic BP Percentile --      Pulse Rate 03/20/24 0752 89     Resp 03/20/24 0752 18     Temp 03/20/24 0752 98.3 F (36.8 C)     Temp src --      SpO2 03/20/24 0752 99 %     Weight --      Height --      Head Circumference --      Peak Flow --      Pain Score 03/20/24 0750 7     Pain Loc --      Pain Education --      Exclude from Growth Chart --     Most recent vital signs: Vitals:   03/20/24 0945 03/20/24 1000  BP:  113/71  Pulse: 62 66  Resp:  16   Temp:    SpO2: 95% 98%     General: Awake, no distress.  Alert, talkative, ambulatory without any assistance. CV:  Good peripheral perfusion.  Heart regular rate and rhythm. Resp:  Normal effort.  Lungs clear bilaterally. Abd:  No distention.  No CVA tenderness. Other:  No point tenderness on palpation of the vertebral bodies in the thoracic area however patient does report bilateral paravertebral tenderness to palpation.  Range of motion is that restriction.  Patient is able to stand and ambulate without any assistance.   ED Results / Procedures / Treatments   Labs (all labs ordered are listed, but only abnormal results are displayed) Labs Reviewed  URINALYSIS, ROUTINE W REFLEX MICROSCOPIC - Abnormal; Notable for the following components:      Result Value   Color, Urine STRAW (*)    APPearance CLEAR (*)    Leukocytes,Ua TRACE (*)    All other components within normal limits  BASIC METABOLIC PANEL WITH GFR - Abnormal; Notable for the following components:  Potassium 3.1 (*)    All other components within normal limits  CBC WITH DIFFERENTIAL/PLATELET      RADIOLOGY Thoracic spine x-ray images were reviewed and interpreted by myself independent of the radiologist with degenerative changes/endplate spurring anteriorly.  Official radiology report mentions mild thoracic kyphoscoliosis that was also noted in 2021.    PROCEDURES:  Critical Care performed:   Procedures   MEDICATIONS ORDERED IN ED: Medications  ondansetron  (ZOFRAN ) injection 4 mg (4 mg Intravenous Given 03/20/24 9177)     IMPRESSION / MDM / ASSESSMENT AND PLAN / ED COURSE  I reviewed the triage vital signs and the nursing notes.   Differential diagnosis includes, but is not limited to, urinary tract infection, urolithiasis, pyelonephritis, musculoskeletal pain, musculoskeletal strain, thoracic compression fracture, multiple musculoskeletal pain secondary to tickborne illness.  53 year old female  presents to the ED with complaint of possible urinary tract infection versus multiple vague muscle and joint aches.  History as reported above.  Urinalysis showed only a trace of leukocytes on the dipstick with no bacteria.  Urinalysis was reviewed from her PCP office with culture growing 10-15,000 colonies strep B.  Patient currently is taking amoxicillin for this.  Thoracic x-ray showed some endplate spurring which was seen in 2021.  Remaining lab work was unremarkable.  After talking with patient she states that her musculoskeletal symptoms began after getting the tick bite.  She is agreeable to go on the doxycycline  to cover any tickborne illnesses.  She is reassured that her urinary tract infection has cleared and that her urinalysis does not look suspicious for a urolithiasis.  Patient is to follow-up with her PCP if any continued problems also a prescription for etodolac  400 mg twice daily with food for her musculoskeletal pain.      Patient's presentation is most consistent with acute complicated illness / injury requiring diagnostic workup.  FINAL CLINICAL IMPRESSION(S) / ED DIAGNOSES   Final diagnoses:  Musculoskeletal pain  Tick bite of scalp, initial encounter     Rx / DC Orders   ED Discharge Orders          Ordered    doxycycline  (VIBRAMYCIN ) 100 MG capsule  2 times daily        03/20/24 0953    etodolac  (LODINE ) 400 MG tablet  2 times daily        03/20/24 9046             Note:  This document was prepared using Dragon voice recognition software and may include unintentional dictation errors.   Saunders Shona CROME, PA-C 03/20/24 1056    Suzanne Kirsch, MD 03/20/24 [phone]

## 2024-03-20 NOTE — ED Triage Notes (Signed)
 Pt comes in via pow with complaints of flank pain 7/10 that started about 3 days ago. Pt was seen at acute care yesterday and was told that she has a UTI. Pt started her prescribed antibiotics yesterday. Pt was told to come to the ER if her pain started to get worse. Pt has a history of UTI's.

## 2024-03-21 ENCOUNTER — Inpatient Hospital Stay: Admission: RE | Admit: 2024-03-21 | Source: Ambulatory Visit

## 2024-04-25 ENCOUNTER — Other Ambulatory Visit: Payer: Self-pay | Admitting: Unknown Physician Specialty

## 2024-04-25 DIAGNOSIS — M792 Neuralgia and neuritis, unspecified: Secondary | ICD-10-CM

## 2024-04-25 DIAGNOSIS — R590 Localized enlarged lymph nodes: Secondary | ICD-10-CM

## 2024-04-25 DIAGNOSIS — G501 Atypical facial pain: Secondary | ICD-10-CM

## 2024-04-26 ENCOUNTER — Other Ambulatory Visit
Admission: RE | Admit: 2024-04-26 | Discharge: 2024-04-26 | Disposition: A | Discharge status: Home / Self Care | Source: Ambulatory Visit | Attending: Infectious Diseases | Admitting: Infectious Diseases

## 2024-04-26 ENCOUNTER — Encounter: Payer: Self-pay | Admitting: Infectious Diseases

## 2024-04-26 ENCOUNTER — Ambulatory Visit: Attending: Infectious Diseases | Admitting: Infectious Diseases

## 2024-04-26 VITALS — BP 111/67 | HR 66 | Temp 98.4°F | Ht 66.5 in | Wt 152.0 lb

## 2024-04-26 DIAGNOSIS — R42 Dizziness and giddiness: Secondary | ICD-10-CM | POA: Insufficient documentation

## 2024-04-26 DIAGNOSIS — W57XXXA Bitten or stung by nonvenomous insect and other nonvenomous arthropods, initial encounter: Secondary | ICD-10-CM | POA: Insufficient documentation

## 2024-04-26 DIAGNOSIS — G479 Sleep disorder, unspecified: Secondary | ICD-10-CM | POA: Diagnosis not present

## 2024-04-26 DIAGNOSIS — R5383 Other fatigue: Secondary | ICD-10-CM | POA: Insufficient documentation

## 2024-04-26 DIAGNOSIS — M436 Torticollis: Secondary | ICD-10-CM | POA: Diagnosis not present

## 2024-04-26 DIAGNOSIS — R200 Anesthesia of skin: Secondary | ICD-10-CM | POA: Insufficient documentation

## 2024-04-26 DIAGNOSIS — Z8619 Personal history of other infectious and parasitic diseases: Secondary | ICD-10-CM | POA: Diagnosis not present

## 2024-04-26 DIAGNOSIS — R29810 Facial weakness: Secondary | ICD-10-CM | POA: Insufficient documentation

## 2024-04-26 DIAGNOSIS — R591 Generalized enlarged lymph nodes: Secondary | ICD-10-CM | POA: Insufficient documentation

## 2024-04-26 DIAGNOSIS — R519 Headache, unspecified: Secondary | ICD-10-CM | POA: Diagnosis not present

## 2024-04-26 DIAGNOSIS — S0006XA Insect bite (nonvenomous) of scalp, initial encounter: Secondary | ICD-10-CM | POA: Insufficient documentation

## 2024-04-26 DIAGNOSIS — M542 Cervicalgia: Secondary | ICD-10-CM | POA: Insufficient documentation

## 2024-04-26 LAB — CBC WITH DIFFERENTIAL/PLATELET
Abs Immature Granulocytes: 0.02 K/uL (ref 0.00–0.07)
Basophils Absolute: 0 K/uL (ref 0.0–0.1)
Basophils Relative: 1 %
Eosinophils Absolute: 0.1 K/uL (ref 0.0–0.5)
Eosinophils Relative: 1 %
HCT: 40.3 % (ref 36.0–46.0)
Hemoglobin: 13.6 g/dL (ref 12.0–15.0)
Immature Granulocytes: 0 %
Lymphocytes Relative: 34 %
Lymphs Abs: 1.7 K/uL (ref 0.7–4.0)
MCH: 31.5 pg (ref 26.0–34.0)
MCHC: 33.7 g/dL (ref 30.0–36.0)
MCV: 93.3 fL (ref 80.0–100.0)
Monocytes Absolute: 0.3 K/uL (ref 0.1–1.0)
Monocytes Relative: 6 %
Neutro Abs: 3 K/uL (ref 1.7–7.7)
Neutrophils Relative %: 58 %
Platelets: UNDETERMINED K/uL (ref 150–400)
RBC: 4.32 MIL/uL (ref 3.87–5.11)
RDW: 12.4 % (ref 11.5–15.5)
WBC: 5.1 K/uL (ref 4.0–10.5)
nRBC: 0 % (ref 0.0–0.2)

## 2024-04-26 LAB — HEPATITIS C ANTIBODY: HCV Ab: NONREACTIVE

## 2024-04-26 LAB — HEPATITIS A ANTIBODY, TOTAL: hep A Total Ab: NONREACTIVE

## 2024-04-26 LAB — HEPATITIS B SURFACE ANTIGEN: Hepatitis B Surface Ag: NONREACTIVE

## 2024-04-26 LAB — HIV ANTIBODY (ROUTINE TESTING W REFLEX): HIV Screen 4th Generation wRfx: NONREACTIVE

## 2024-04-26 LAB — SEDIMENTATION RATE: Sed Rate: 3 mm/h (ref 0–30)

## 2024-04-26 NOTE — Patient Instructions (Addendum)
 You came in today due to persistent lymph node swelling and fatigue following a tick bite. You have been experiencing a range of symptoms including headaches, neck pain, dizziness, and facial numbness. Despite previous treatments, your symptoms have not improved, and we are considering other possible diagnoses.  YOUR PLAN:  -CERVICAL AND GENERALIZED LYMPHADENOPATHY WITH ASSOCIATED FATIGUE, NECK PAIN, HEADACHE, DIZZINESS, AND FACIAL PARESTHESIA FOLLOWING TICK EXPOSURE: Lymphadenopathy means swelling of the lymph nodes, which can occur due to infections or other conditions. Your symptoms have persisted despite treatment for a tick-borne illness, so we are ordering blood tests to check for other infections like Ehrlichiosis, [facility] spotted fever, Epstein-Barr virus, HIV, hepatitis, RPR,. We will also review your MRI results once they are available.  -HISTORY OF TICK BITE AND POSSIBLE TICK-BORNE ILLNESS (LYME DISEASE EXPOSURE, EHRLICHIOSIS, [place] SPOTTED FEVER): You have a history of a tick bite and symptoms that could be related to tick-borne illnesses. Initial tests showed past exposure to Lyme disease, but not an active infection. wondering whether your symptoms are post infectious state of another viral or Tick borne illness  INSTRUCTIONS:  Please complete the blood tests as soon as possible. We will contact you to review the results and discuss the next steps. Hold off on taking any more doxycycline  until we have more information. Additionally, we will review your MRI results once they are available.

## 2024-04-26 NOTE — Progress Notes (Unsigned)
 NAME: Joy Pineda  DOB: 09/20/1970  MRN: [id number]  Date/Time: 04/26/2024 11:44 AM  REQUESTING PROVIDER Subjective:  REASON FOR CONSULT:  Agreed to the use of AI scribe Joy Pineda is a 53 year old female who presents with persistent lymphadenopathy and fatigue following a tick bite. She was referred by Dr. Herminio for evaluation of persistent symptoms following a tick bite.  On July 5th, she discovered a tick on her head, resembling a small seed, which was removed after about ten minutes. No erythema migrans was noted. Initially, no immediate action was taken.  Around July 17th, she developed severe headaches localized to the top of her head, exacerbated by hair movement, and a sensation of fullness in her ears. She was diagnosed with a possible ear infection and prescribed azithromycin on July 9th and again on July 24th, but symptoms persisted.  She was later informed of a Group B strep finding in her urine and was prescribed amoxicillin on August 2nd. Despite treatment, her symptoms worsened, including lymphadenopathy, severe fatigue, and dizziness.  After receiving prednisone for an eardrum issue, she experienced exacerbation of symptoms, including widespread lymphadenopathy, severe neck pain, and lethargy. At the ER, she was told she had a positive tick test indicating past exposure to Lyme disease, and she recalls being told it was not an acute infection. CRP was less than one.  She was treated with doxycycline  for suspected tick-borne illness for a total of 28 days. Initially, she felt better, but symptoms returned after discontinuation, including fatigue, neck stiffness, and facial numbness.  Current symptoms include right-sided facial numbness, neck stiffness, dizziness, and fatigue. Eating exacerbates symptoms, and she experiences significant discomfort in her neck, particularly on the right side. She feels 'wiped out' and has difficulty sleeping due to neck pain.  She has a  history of mononucleosis in 2014, which had a prolonged recovery, and she relates her current symptoms to that experience. No fever, rash, diarrhea, or weight loss. History of mitral valve issues and irregular PVCs.  She works as a Psychologist, prison and probation services, lives with her husband and son, and has a Development worker, international aid. She avoids outdoor activities due to allergies.   Past Medical History:  Diagnosis Date   Diffuse cystic mastopathy 2011   RIGHT   Heart murmur    Irritable bowel syndrome    Mitral valve prolapse 1995    Past Surgical History:  Procedure Laterality Date   ABDOMINAL HYSTERECTOMY  2006   CESAREAN SECTION  [phone]   CHOLECYSTECTOMY  1998   TONSILLECTOMY     TUBAL LIGATION     VEIN SURGERY  2006    Social History   Socioeconomic History   Marital status: Married    Spouse name: Not on file   Number of children: Not on file   Years of education: Not on file   Highest education level: Not on file  Occupational History   Not on file  Tobacco Use   Smoking status: Never   Smokeless tobacco: Never  Substance and Sexual Activity   Alcohol use: No   Drug use: No   Sexual activity: Not on file  Other Topics Concern   Not on file  Social History Narrative   Not on file   Social Drivers of Health   Financial Resource Strain: Low Risk  (11/19/2023)   Received from [facility]   Overall Financial Resource Strain (CARDIA)    Difficulty of Paying Living Expenses: Not hard at all  Food  Insecurity: No Food Insecurity (11/19/2023)   Received from [facility]   Hunger Vital Sign    Within the past 12 months, you worried that your food would run out before you got the money to buy more.: Never true    Within the past 12 months, the food you bought just didn't last and you didn't have money to get more.: Never true  Transportation Needs: No Transportation Needs (11/19/2023)   Received from [facility]     In the past 12 months, has lack of transportation kept you from medical appointments or from getting medications?: No    Lack of Transportation (Non-Medical): No  Physical Activity: Not on file  Stress: Not on file  Social Connections: Not on file  Intimate Partner Violence: Not on file    Family History  Problem Relation Age of Onset   Heart disease Mother    Diabetes Mother    Allergies  Allergen Reactions   Shellfish Allergy Hives   Fire Ant Swelling   Tape     BLISTERS   Cephalexin Hives and Rash    Other reaction(s): Unknown   Ciprofloxacin Rash   I? Current Outpatient Medications  Medication Sig Dispense Refill   Cyanocobalamin (VITAMIN B-12 IJ) Vitamin B12     EPINEPHrine 0.3 mg/0.3 mL IJ SOAJ injection as directed Injection prn for systemic reactions     linaclotide (LINZESS) 145 MCG CAPS capsule Take 145 mcg by mouth daily before breakfast.     pantoprazole (PROTONIX) 20 MG tablet Take 20 mg by mouth daily.     No current facility-administered medications for this visit.     Abtx:  Anti-infectives (From admission, onward)    None       REVIEW OF SYSTEMS:  Const: negative fever, negative chills, negative weight loss Eyes: negative diplopia or visual changes, negative eye pain ENT: as above Resp: negative cough, hemoptysis, dyspnea Cards: negative for chest pain, palpitations, lower extremity edema GU: negative for frequency, dysuria and hematuria GI: Negative for abdominal pain, diarrhea, bleeding, constipation Skin: negative for rash and pruritus Heme: negative for easy bruising and gum/nose bleeding MS: negative for myalgias, arthralgias, back pain and muscle weakness Neurolo:negative for headaches, dizziness, vertigo, memory problems  Psych: negative for feelings of anxiety, depression  Endocrine: negative for thyroid, diabetes Allergy/Immunology- negative for any medication or food allergies ?  Objective:  VITALS:  BP 111/67   Pulse 66   Temp  98.4 F (36.9 C) (Temporal)   Ht 5' 6.5 (1.689 m)   Wt 152 lb (68.9 kg)   SpO2 99%   BMI 24.17 kg/m   PHYSICAL EXAM:  General: Alert, cooperative, no distress, appears stated age.  Head: Normocephalic, without obvious abnormality, atraumatic. Eyes: Conjunctivae clear, anicteric sclerae. Pupils are equal ENT Nares normal. No drainage or sinus tenderness. Lips, mucosa, and tongue normal. No Thrush Neck: Supple, symmetrical, < 1 cm rt sided cervical  adenopathy,X 2 thyroid: non tender no carotid bruit and no JVD. Back: No CVA tenderness. Lungs: Clear to auscultation bilaterally. No Wheezing or Rhonchi. No rales. Heart: Regular rate and rhythm, no murmur, rub or gallop. Abdomen: Soft, non-tender,not distended. Bowel sounds normal. No masses Extremities: atraumatic, no cyanosis. No edema. No clubbing Skin: No rashes or lesions. Or bruising Lymph: Cervical, supraclavicular normal. Neurologic: Grossly non-focal Pertinent Labs Lab Results CBC    Component Value Date/Time   WBC 4.5 03/20/2024 0759   RBC 4.33 03/20/2024 0759  HGB 13.7 03/20/2024 0759   HGB 14.8 09/12/2012 0021   HCT 41.7 03/20/2024 0759   HCT 43.1 09/12/2012 0021   PLT 297 03/20/2024 0759   PLT 315 09/12/2012 0021   MCV 96.3 03/20/2024 0759   MCV 95 09/12/2012 0021   MCH 31.6 03/20/2024 0759   MCHC 32.9 03/20/2024 0759   RDW 12.6 03/20/2024 0759   RDW 13.4 09/12/2012 0021   LYMPHSABS 1.8 03/20/2024 0759   LYMPHSABS 1.3 09/12/2012 0021   MONOABS 0.4 03/20/2024 0759   MONOABS 0.4 09/12/2012 0021   EOSABS 0.1 03/20/2024 0759   EOSABS 0.0 09/12/2012 0021   BASOSABS 0.1 03/20/2024 0759   BASOSABS 0.1 09/12/2012 0021       Latest Ref Rng & Units 03/20/2024    7:59 AM 12/16/2019   10:38 AM 09/12/2012   12:21 AM  CMP  Glucose 70 - 99 mg/dL 99   875   BUN 6 - 20 mg/dL 14   9   Creatinine 9.55 - 1.00 mg/dL 9.43  9.39  9.40   Sodium 135 - 145 mmol/L 140   139   Potassium 3.5 - 5.1 mmol/L 3.1   3.7   Chloride  98 - 111 mmol/L 101   106   CO2 22 - 32 mmol/L 28   27   Calcium 8.9 - 10.3 mg/dL 9.2   9.2   Total Protein 6.4 - 8.2 g/dL   8.1   Total Bilirubin 0.2 - 1.0 mg/dL   0.9   Alkaline Phos 50 - 136 Unit/L   82   AST 15 - 37 Unit/L   18   ALT 12 - 78 U/L   20       None Awaiting MRI IMAGING RESULTS:  ? Impression/Recommendation ?Cervical and generalized lymphadenopathy with associated fatigue, neck pain, headache, dizziness, and facial paresthesia following tick exposure   Symptoms persist despite doxycycline  treatment. Lymphadenopathy is atypical for Lyme disease,  suspicion for Ehrlichiosis . Symptoms resemble previous mononucleosis she states, suggesting possible Epstein-Barr virus reactivation. Prednisone may have exacerbated symptoms by masking an underlying infection. Order blood tests for Ehrlichia antibodies, Lyme EIA with reflex WB, Epstein-Barr virus PCR, HIV, hepatitis, . Hold off on further doxycycline  treatment. Review MRI results once available.  History of tick bite and possible tick-borne illness (Lyme disease exposure, Ehrlichiosis, [place] spotted fever)   Initial Lyme serology indicated past exposure but not acute infection. Symptoms and tick description suggest possible Ehrlichiosis, especially given the presence of a lone star tick. Persistent symptoms post-doxycycline  treatment indicate possible misdiagnosis or incomplete treatment. Consider alternative diagnoses if symptoms persist. Discussed the management in detail Follow after labs

## 2024-04-27 LAB — TOXOPLASMA ANTIBODIES- IGG AND  IGM
Toxoplasma Antibody- IgM: <3 [AU]/ml (ref 0.0–7.9)
Toxoplasma IgG Ratio: <3 [IU]/mL (ref 0.0–7.1)

## 2024-04-27 LAB — LYME IGG/IGM
Lyme IgG EIA: NEGATIVE
Lyme IgM EIA: NEGATIVE

## 2024-04-27 LAB — HEPATITIS B SURFACE ANTIBODY, QUANTITATIVE: Hep B S AB Quant (Post): 486 m[IU]/mL

## 2024-04-27 LAB — HEPATITIS B CORE ANTIBODY, TOTAL: HEP B CORE AB: NEGATIVE

## 2024-04-27 LAB — RPR: RPR Ser Ql: NONREACTIVE

## 2024-04-27 LAB — LYME DISEASE SEROLOGY W/REFLEX: Lyme Total Antibody EIA: UNDETERMINED

## 2024-04-28 LAB — EHRLICHIA ANTIBODY PANEL
E chaffeensis (HGE) Ab, IgG: NEGATIVE
E chaffeensis (HGE) Ab, IgM: NEGATIVE
E. Chaffeensis (HME) IgM Titer: NEGATIVE
E.Chaffeensis (HME) IgG: NEGATIVE

## 2024-04-28 LAB — EPSTEIN BARR VRS(EBV DNA BY PCR): EBV DNA QN by PCR: NEGATIVE [IU]/mL

## 2024-05-03 ENCOUNTER — Ambulatory Visit: Payer: Self-pay

## 2024-05-03 ENCOUNTER — Other Ambulatory Visit

## 2024-05-10 ENCOUNTER — Other Ambulatory Visit

## 2024-05-20 ENCOUNTER — Other Ambulatory Visit

## 2024-05-20 ENCOUNTER — Other Ambulatory Visit: Payer: Self-pay | Admitting: Infectious Diseases

## 2024-05-20 DIAGNOSIS — M2550 Pain in unspecified joint: Secondary | ICD-10-CM

## 2024-05-20 DIAGNOSIS — Z2821 Immunization not carried out because of patient refusal: Secondary | ICD-10-CM

## 2024-05-20 DIAGNOSIS — W57XXXS Bitten or stung by nonvenomous insect and other nonvenomous arthropods, sequela: Secondary | ICD-10-CM

## 2024-05-20 DIAGNOSIS — R590 Localized enlarged lymph nodes: Secondary | ICD-10-CM

## 2024-05-26 ENCOUNTER — Ambulatory Visit
Admission: RE | Admit: 2024-05-26 | Discharge: 2024-05-26 | Disposition: A | Discharge status: Home / Self Care | Source: Ambulatory Visit | Attending: Infectious Diseases | Admitting: Infectious Diseases

## 2024-05-26 DIAGNOSIS — M2550 Pain in unspecified joint: Secondary | ICD-10-CM | POA: Diagnosis not present

## 2024-05-26 DIAGNOSIS — W57XXXS Bitten or stung by nonvenomous insect and other nonvenomous arthropods, sequela: Secondary | ICD-10-CM

## 2024-05-26 DIAGNOSIS — Z2821 Immunization not carried out because of patient refusal: Secondary | ICD-10-CM

## 2024-05-26 DIAGNOSIS — W57XXXA Bitten or stung by nonvenomous insect and other nonvenomous arthropods, initial encounter: Secondary | ICD-10-CM | POA: Diagnosis not present

## 2024-05-26 DIAGNOSIS — R221 Localized swelling, mass and lump, neck: Secondary | ICD-10-CM | POA: Diagnosis not present

## 2024-05-26 DIAGNOSIS — R590 Localized enlarged lymph nodes: Secondary | ICD-10-CM

## 2024-05-26 MED ORDER — IOHEXOL 300 MG/ML  SOLN
75.0000 mL | Freq: Once | INTRAMUSCULAR | Status: AC | PRN
  Administered 2024-05-26: 75 mL via INTRAVENOUS

## 2024-07-18 ENCOUNTER — Other Ambulatory Visit: Payer: Self-pay | Admitting: Physician Assistant

## 2024-07-18 DIAGNOSIS — R1031 Right lower quadrant pain: Secondary | ICD-10-CM

## 2024-07-18 DIAGNOSIS — R600 Localized edema: Secondary | ICD-10-CM

## 2024-07-18 DIAGNOSIS — R198 Other specified symptoms and signs involving the digestive system and abdomen: Secondary | ICD-10-CM

## 2024-07-22 ENCOUNTER — Other Ambulatory Visit: Payer: Self-pay | Admitting: Internal Medicine

## 2024-07-22 DIAGNOSIS — M25551 Pain in right hip: Secondary | ICD-10-CM

## 2024-07-26 ENCOUNTER — Ambulatory Visit: Admission: RE | Admit: 2024-07-26 | Discharge: 2024-07-26 | Attending: Internal Medicine | Admitting: Internal Medicine

## 2024-07-26 DIAGNOSIS — M25551 Pain in right hip: Secondary | ICD-10-CM
